# Patient Record
Sex: Female | Born: 1995 | Race: White | Hispanic: No | Marital: Married | Smoking: Never smoker
Health system: Southern US, Community
[De-identification: ages and names within clinical notes are randomized; demographics above are authoritative.]

## PROBLEM LIST (undated history)

## (undated) ENCOUNTER — Inpatient Hospital Stay (HOSPITAL_COMMUNITY): Payer: Self-pay

## (undated) DIAGNOSIS — F909 Attention-deficit hyperactivity disorder, unspecified type: Secondary | ICD-10-CM

## (undated) DIAGNOSIS — K589 Irritable bowel syndrome without diarrhea: Secondary | ICD-10-CM

## (undated) DIAGNOSIS — J45909 Unspecified asthma, uncomplicated: Secondary | ICD-10-CM

## (undated) DIAGNOSIS — F419 Anxiety disorder, unspecified: Secondary | ICD-10-CM

## (undated) DIAGNOSIS — Z8742 Personal history of other diseases of the female genital tract: Secondary | ICD-10-CM

## (undated) HISTORY — PX: WISDOM TOOTH EXTRACTION: SHX21

## (undated) NOTE — *Deleted (*Deleted)
Hypoglycemic Event  CBG: 61 Treatment: 4 oz of apple juice given at 1835  Symptoms: lightheaded  Follow-up CBG: Time:1828 CBG Result:61  Possible Reasons for Event:GDM on clear liquids Comments/MD notified:     Jamelle Rushing

---

## 2015-07-12 ENCOUNTER — Other Ambulatory Visit (HOSPITAL_COMMUNITY)
Admission: RE | Admit: 2015-07-12 | Discharge: 2015-07-12 | Disposition: A | Discharge status: Home / Self Care | Payer: BLUE CROSS/BLUE SHIELD | Source: Ambulatory Visit | Attending: Family Medicine | Admitting: Family Medicine

## 2015-07-12 DIAGNOSIS — Z01411 Encounter for gynecological examination (general) (routine) with abnormal findings: Secondary | ICD-10-CM | POA: Insufficient documentation

## 2016-08-01 DIAGNOSIS — M79674 Pain in right toe(s): Secondary | ICD-10-CM | POA: Diagnosis not present

## 2017-01-25 DIAGNOSIS — J019 Acute sinusitis, unspecified: Secondary | ICD-10-CM | POA: Diagnosis not present

## 2017-04-08 DIAGNOSIS — R5383 Other fatigue: Secondary | ICD-10-CM | POA: Diagnosis not present

## 2017-04-08 DIAGNOSIS — J069 Acute upper respiratory infection, unspecified: Secondary | ICD-10-CM | POA: Diagnosis not present

## 2017-07-10 DIAGNOSIS — K581 Irritable bowel syndrome with constipation: Secondary | ICD-10-CM | POA: Diagnosis not present

## 2017-07-10 DIAGNOSIS — G4700 Insomnia, unspecified: Secondary | ICD-10-CM | POA: Diagnosis not present

## 2017-08-30 DIAGNOSIS — K581 Irritable bowel syndrome with constipation: Secondary | ICD-10-CM | POA: Diagnosis not present

## 2017-08-30 DIAGNOSIS — Z23 Encounter for immunization: Secondary | ICD-10-CM | POA: Diagnosis not present

## 2017-08-30 DIAGNOSIS — Z0000 Encounter for general adult medical examination without abnormal findings: Secondary | ICD-10-CM | POA: Diagnosis not present

## 2018-04-27 ENCOUNTER — Ambulatory Visit (HOSPITAL_COMMUNITY)
Admission: EM | Admit: 2018-04-27 | Discharge: 2018-04-27 | Disposition: A | Discharge status: Home / Self Care | Payer: 59 | Source: Home / Self Care | Attending: Family Medicine | Admitting: Family Medicine

## 2018-04-27 ENCOUNTER — Emergency Department (HOSPITAL_COMMUNITY): Payer: 59

## 2018-04-27 ENCOUNTER — Emergency Department (HOSPITAL_COMMUNITY)
Admission: EM | Admit: 2018-04-27 | Discharge: 2018-04-27 | Disposition: A | Discharge status: Home / Self Care | Payer: 59 | Attending: Emergency Medicine | Admitting: Emergency Medicine

## 2018-04-27 ENCOUNTER — Other Ambulatory Visit: Payer: Self-pay

## 2018-04-27 ENCOUNTER — Encounter (HOSPITAL_COMMUNITY): Payer: Self-pay | Admitting: Emergency Medicine

## 2018-04-27 ENCOUNTER — Encounter (HOSPITAL_COMMUNITY): Payer: Self-pay | Admitting: *Deleted

## 2018-04-27 DIAGNOSIS — J40 Bronchitis, not specified as acute or chronic: Secondary | ICD-10-CM | POA: Diagnosis not present

## 2018-04-27 DIAGNOSIS — R069 Unspecified abnormalities of breathing: Secondary | ICD-10-CM | POA: Insufficient documentation

## 2018-04-27 DIAGNOSIS — R42 Dizziness and giddiness: Secondary | ICD-10-CM | POA: Insufficient documentation

## 2018-04-27 DIAGNOSIS — R6889 Other general symptoms and signs: Secondary | ICD-10-CM

## 2018-04-27 DIAGNOSIS — R05 Cough: Secondary | ICD-10-CM

## 2018-04-27 DIAGNOSIS — R0602 Shortness of breath: Secondary | ICD-10-CM

## 2018-04-27 DIAGNOSIS — R059 Cough, unspecified: Secondary | ICD-10-CM

## 2018-04-27 DIAGNOSIS — R0789 Other chest pain: Secondary | ICD-10-CM

## 2018-04-27 DIAGNOSIS — R509 Fever, unspecified: Secondary | ICD-10-CM | POA: Insufficient documentation

## 2018-04-27 DIAGNOSIS — J069 Acute upper respiratory infection, unspecified: Secondary | ICD-10-CM

## 2018-04-27 HISTORY — DX: Irritable bowel syndrome, unspecified: K58.9

## 2018-04-27 HISTORY — DX: Anxiety disorder, unspecified: F41.9

## 2018-04-27 LAB — CBC WITH DIFFERENTIAL/PLATELET
Abs Immature Granulocytes: 0.02 10*3/uL (ref 0.00–0.07)
Basophils Absolute: 0 10*3/uL (ref 0.0–0.1)
Basophils Relative: 1 %
Eosinophils Absolute: 0.1 10*3/uL (ref 0.0–0.5)
Eosinophils Relative: 1 %
HCT: 42.1 % (ref 36.0–46.0)
HEMOGLOBIN: 13.2 g/dL (ref 12.0–15.0)
Immature Granulocytes: 0 %
Lymphocytes Relative: 18 %
Lymphs Abs: 1 10*3/uL (ref 0.7–4.0)
MCH: 28.3 pg (ref 26.0–34.0)
MCHC: 31.4 g/dL (ref 30.0–36.0)
MCV: 90.1 fL (ref 80.0–100.0)
MONO ABS: 0.8 10*3/uL (ref 0.1–1.0)
Monocytes Relative: 15 %
Neutro Abs: 3.7 10*3/uL (ref 1.7–7.7)
Neutrophils Relative %: 65 %
Platelets: 204 10*3/uL (ref 150–400)
RBC: 4.67 MIL/uL (ref 3.87–5.11)
RDW: 12.6 % (ref 11.5–15.5)
WBC: 5.6 10*3/uL (ref 4.0–10.5)
nRBC: 0 % (ref 0.0–0.2)

## 2018-04-27 LAB — BASIC METABOLIC PANEL
ANION GAP: 9 (ref 5–15)
BUN: 9 mg/dL (ref 6–20)
CO2: 25 mmol/L (ref 22–32)
Calcium: 9.2 mg/dL (ref 8.9–10.3)
Chloride: 105 mmol/L (ref 98–111)
Creatinine, Ser: 0.96 mg/dL (ref 0.44–1.00)
GFR calc Af Amer: >60 mL/min (ref >60)
GFR calc non Af Amer: >60 mL/min (ref >60)
Glucose, Bld: 118 mg/dL — ABNORMAL HIGH (ref 70–99)
Potassium: 3.8 mmol/L (ref 3.5–5.1)
Sodium: 139 mmol/L (ref 135–145)

## 2018-04-27 LAB — I-STAT BETA HCG BLOOD, ED (MC, WL, AP ONLY)

## 2018-04-27 LAB — I-STAT TROPONIN, ED: Troponin i, poc: 0.01 ng/mL (ref 0.00–0.08)

## 2018-04-27 MED ORDER — ONDANSETRON HCL 4 MG/2ML IJ SOLN
4.0000 mg | Freq: Once | INTRAMUSCULAR | Status: AC
  Administered 2018-04-27: 4 mg via INTRAVENOUS
  Filled 2018-04-27: qty 2

## 2018-04-27 MED ORDER — IBUPROFEN 800 MG PO TABS
800.0000 mg | ORAL_TABLET | Freq: Once | ORAL | Status: AC
  Administered 2018-04-27: 800 mg via ORAL
  Filled 2018-04-27: qty 1

## 2018-04-27 MED ORDER — ALBUTEROL SULFATE (2.5 MG/3ML) 0.083% IN NEBU
5.0000 mg | INHALATION_SOLUTION | Freq: Once | RESPIRATORY_TRACT | Status: AC
  Administered 2018-04-27: 5 mg via RESPIRATORY_TRACT
  Filled 2018-04-27: qty 6

## 2018-04-27 MED ORDER — SODIUM CHLORIDE 0.9 % IV BOLUS
1000.0000 mL | Freq: Once | INTRAVENOUS | Status: AC
  Administered 2018-04-27: 1000 mL via INTRAVENOUS

## 2018-04-27 MED ORDER — AZITHROMYCIN 250 MG PO TABS: ORAL_TABLET | ORAL | 0 refills | Status: DC

## 2018-04-27 MED ORDER — IPRATROPIUM BROMIDE 0.02 % IN SOLN
0.5000 mg | Freq: Once | RESPIRATORY_TRACT | Status: AC
  Administered 2018-04-27: 0.5 mg via RESPIRATORY_TRACT
  Filled 2018-04-27: qty 2.5

## 2018-04-27 MED ORDER — ALBUTEROL SULFATE HFA 108 (90 BASE) MCG/ACT IN AERS: 1.0000 | INHALATION_SPRAY | RESPIRATORY_TRACT | 0 refills | Status: AC | PRN

## 2018-04-27 NOTE — ED Provider Notes (Signed)
[facility] EMERGENCY DEPARTMENT Provider Note   CSN: [id number] Arrival date & time: 04/27/18  1207     History   Chief Complaint No chief complaint on file.   HPI Kimberly Hunter is a 23 y.o. female with a PMHx of anxiety and IBS, who presents to the ED with complaints of pneumonia type symptoms for several days.  Patient states that the end of December she got "sick" and was seen at [facility] physicians where she was told she had a virus and given cough medicine.  She states that initially she got better but then 3 days ago she started getting worse.  Over the last 3 days she is had a fever with Tmax 102.0, shortness of breath, cough with yellow sputum production, lightheadedness with standing, chest pain, nausea, congestion, chills, and fatigue.  She describes her chest pain is 7/10 constant sharp nonradiating central chest pain that worsens with cough and has been unrelieved with Mucinex and Tylenol.  She did take Tylenol prior to arrival.  She works at [employer] so she is not sure whether she could have come into contact with some sick people.  She did not receive her flu shot this year.  She is a non-smoker.  She denies any ear pain or drainage, sore throat, leg swelling, recent travel/surgery/immobilization, estrogen use, personal history of DVT/PE, abdominal pain, vomiting, diarrhea, constipation, dysuria, hematuria, myalgias, arthralgias, numbness, tingling, focal weakness, rashes, or any other complaints at this time.  No known family history of cardiac disease.  The history is provided by the patient and medical records. No language interpreter was used.    Past Medical History:  Diagnosis Date  . Anxiety   . IBS (irritable bowel syndrome)     There are no active problems to display for this patient.   History reviewed. No pertinent surgical history.   OB History   No obstetric history on file.      Home Medications    Prior to Admission medications     Medication Sig Start Date End Date Taking? Authorizing Provider  ESCITALOPRAM OXALATE PO Take by mouth.    [provider]  Lubiprostone (AMITIZA PO) Take by mouth.    [provider]    Family History Family History  Problem Relation Age of Onset  . Diabetes Mother   . Hypertension Mother   . Hypercholesterolemia Mother   . Hypertension Father   . Diabetes Sister     Social History Social History   Tobacco Use  . Smoking status: Never Smoker  . Smokeless tobacco: Never Used  Substance Use Topics  . Alcohol use: Yes    Comment: occasionally  . Drug use: Never     Allergies   Patient has no known allergies.   Review of Systems Review of Systems  Constitutional: Positive for chills, fatigue and fever.  HENT: Positive for congestion. Negative for ear discharge, ear pain and sore throat.   Respiratory: Positive for cough and shortness of breath.   Cardiovascular: Positive for chest pain. Negative for leg swelling.  Gastrointestinal: Positive for nausea. Negative for abdominal pain, constipation, diarrhea and vomiting.  Genitourinary: Negative for dysuria and hematuria.  Musculoskeletal: Negative for arthralgias and myalgias.  Skin: Negative for color change and rash.  Allergic/Immunologic: Negative for immunocompromised state.  Neurological: Positive for light-headedness (with standing). Negative for weakness and numbness.  Psychiatric/Behavioral: Negative for confusion.   All other systems reviewed and are negative for acute change except as noted in the  HPI.    Physical Exam Updated Vital Signs BP (!) 138/94   Pulse 100   Temp 100.2 F (37.9 C) (Oral)   Ht 5\' 7"  (1.702 m)   Wt 95.3 kg   LMP 04/07/2018 (Approximate)   SpO2 97%   BMI 32.89 kg/m   Physical Exam Vitals signs and nursing note reviewed.  Constitutional:      General: She is not in acute distress.    Appearance: Normal appearance. She is well-developed. She is not  toxic-appearing.     Comments: Low grade temp 100.2, nontoxic, NAD, coughing throughout most of exam  HENT:     Head: Normocephalic and atraumatic.     Nose: Congestion present.  Eyes:     General:        Right eye: No discharge.        Left eye: No discharge.     Conjunctiva/sclera: Conjunctivae normal.  Neck:     Musculoskeletal: Normal range of motion and neck supple.  Cardiovascular:     Rate and Rhythm: Regular rhythm. Tachycardia present.     Pulses: Normal pulses.     Heart sounds: Normal heart sounds, S1 normal and S2 normal. No murmur. No friction rub. No gallop.      Comments: Tachycardic in the low 100s unless coughing at which time it goes into the 110s. Reg rhythm, nl s1/s2, no m/r/g, distal pulses intact, no pedal edema.  Pulmonary:     Effort: Pulmonary effort is normal. No respiratory distress.     Breath sounds: Wheezing and rhonchi present. No decreased breath sounds or rales.     Comments: Faint wheezing and rhonchi in RLL, no rales, no hypoxia or increased WOB, speaking in full sentences except coughing throughout most of exam, SpO2 97% on RA  Chest:     Chest wall: Tenderness present. No deformity or crepitus.     Comments: Mild diffuse anterior chest wall TTP, no crepitus or deformity, no retractions Abdominal:     General: Bowel sounds are normal. There is no distension.     Palpations: Abdomen is soft. Abdomen is not rigid.     Tenderness: There is no abdominal tenderness. There is no right CVA tenderness, left CVA tenderness, guarding or rebound. Negative signs include Murphy's sign and McBurney's sign.  Musculoskeletal: Normal range of motion.  Skin:    General: Skin is warm and dry.     Findings: No rash.  Neurological:     Mental Status: She is alert and oriented to person, place, and time.     Sensory: Sensation is intact. No sensory deficit.     Motor: Motor function is intact.  Psychiatric:        Mood and Affect: Mood and affect normal.         Behavior: Behavior normal.      ED Treatments / Results  Labs (all labs ordered are listed, but only abnormal results are displayed) Labs Reviewed  BASIC METABOLIC PANEL - Abnormal; Notable for the following components:      Result Value   Glucose, Bld 118 (*)    All other components within normal limits  CBC WITH DIFFERENTIAL/PLATELET  I-STAT TROPONIN, ED  I-STAT BETA HCG BLOOD, ED ([facility], [facility], [facility] ONLY)    EKG EKG Interpretation  Date/Time:  Sunday April 27 2018 13:28:33 EST Ventricular Rate:  120 PR Interval:    QRS Duration: 82 QT Interval:  328 QTC Calculation: 464 R Axis:   77 Text Interpretation:  Sinus tachycardia Nonspecific T abnormalities, anterior leads Baseline wander in lead(s) II III aVF No old tracing to compare Confirmed by Azalia Bilisampos, Kevin ([id number]) on 04/27/2018 1:53:20 PM   Radiology Dg Chest 2 View  Result Date: 04/27/2018 CLINICAL DATA:  Cough, chest pain and SOB. EXAM: CHEST - 2 VIEW COMPARISON:  None FINDINGS: The heart size and mediastinal contours are within normal limits. Both lungs are clear. The visualized skeletal structures are unremarkable. IMPRESSION: No active cardiopulmonary disease. Electronically Signed   By: Signa Kellaylor  Stroud M.D.   On: 04/27/2018 14:00    Procedures Procedures (including critical care time)  Medications Ordered in ED Medications  ibuprofen (ADVIL,MOTRIN) tablet 800 mg (800 mg Oral Given 04/27/18 1258)  sodium chloride 0.9 % bolus 1,000 mL (0 mLs Intravenous Stopped 04/27/18 1403)  ondansetron (ZOFRAN) injection 4 mg (4 mg Intravenous Given 04/27/18 1258)  albuterol (PROVENTIL) (2.5 MG/3ML) 0.083% nebulizer solution 5 mg (5 mg Nebulization Given 04/27/18 1302)  ipratropium (ATROVENT) nebulizer solution 0.5 mg (0.5 mg Nebulization Given 04/27/18 1357)     Initial Impression / Assessment and Plan / ED Course  I have reviewed the triage vital signs and the nursing notes.  Pertinent labs & imaging results that were available  during my care of the patient were reviewed by me and considered in my medical decision making (see chart for details).     23 y.o. female here with worsening cough.  States that she was sick at the end of December, got better initially however in the last 3 days she has gotten worse.  On exam, faint wheezing and rhonchi in the right lower lobe, harsh cough throughout entirety of visit, low-grade temp 100.2, mildly tachycardic especially when she coughs, however no increased work of breathing and no hypoxia.  No leg swelling.  We this sounds like pneumonia versus bronchitis doubt PE.  Chest wall tender to palpation anteriorly.  Will get labs, chest x-ray, EKG, and give ibuprofen, fluids, Zofran, and duo nebs, and reassess shortly.  Doubt need for flu testing since she is outside the Tamiflu window. Does not appear septic, doubt need for code sepsis labs/fluids/etc. Will reassess shortly.   2:48 PM CBC w/diff WNL. BMP unremarkable. Trop neg. BetaHCG neg. CXR negative. EKG with nonspecific T changes but overall without acute ischemic findings. Pt feeling better and lung sounds improved after duonebs. VSS improving after interventions. Overall, given duration of symptoms and sudden worsening after period of improvement, will empirically cover for atypical bacterial infection. Advised that she may have a viral illness and abx won't change the course of that, but again I think it's reasonable given the course of her illness. Will send home with inhaler rx, pt states she has a brand new one at home so no need to send her home with one from here. Advised OTC remedies for symptomatic relief, and f/up with PCP in 1wk for recheck. I explained the diagnosis and have given explicit precautions to return to the ER including for any other new or worsening symptoms. The patient understands and accepts the medical plan as it's been dictated and I have answered their questions. Discharge instructions concerning home care and  prescriptions have been given. The patient is STABLE and is discharged to home in good condition.    Final Clinical Impressions(s) / ED Diagnoses   Final diagnoses:  Bronchitis  Upper respiratory tract infection, unspecified type  Flu-like symptoms  Chest wall pain  SOB (shortness of breath)    ED Discharge Orders  Ordered    azithromycin (ZITHROMAX Z-PAK) 250 MG tablet     04/27/18 1448    albuterol (PROVENTIL HFA;VENTOLIN HFA) 108 (90 Base) MCG/ACT inhaler  Every 4 hours PRN     04/27/18 [address]1448           Timmia Cogburn, RomeMercedes, Cordelia Poche-C 04/27/18 1454    Azalia Bilisampos, Kevin, MD 04/27/18 [id number]

## 2018-04-27 NOTE — ED Notes (Signed)
Patient verbalizes understanding of discharge instructions. Opportunity for questioning and answers were provided. Armband removed by staff, pt discharged from ED ambulatory to home.  

## 2018-04-27 NOTE — ED Triage Notes (Signed)
C/O URI sxs since 03/25/18; states cough has never resolved.  Now having fever over past couple days.

## 2018-04-27 NOTE — Discharge Instructions (Signed)
Your work up today was reassuring, but since your symptoms have worsened recently, we are treating you with antibiotics to cover for possible bacterial infections. Take antibiotic as directed until completed. Continue to stay well-hydrated. Gargle warm salt water and spit it out and use chloraseptic spray as needed for sore throat. Continue to alternate between Tylenol and Ibuprofen for pain or fever. Use Mucinex/Robitussin/etc for cough suppression/expectoration of mucus. Use over the counter flonase and the netipot to help with nasal congestion. May consider over-the-counter Benadryl or other antihistamine like Claritin/Zyrtec/etc to decrease secretions and for help with your symptoms. Use inhaler as directed, as needed for cough/chest congestion/wheezing/shortness of breath. Follow up with your primary care doctor in 5-7 days for recheck of ongoing symptoms. Return to emergency department for emergent changing or worsening of symptoms.

## 2018-04-27 NOTE — ED Notes (Signed)
Bed: UC03 Expected date:  Expected time:  Means of arrival:  Comments: Cleaning

## 2018-04-27 NOTE — ED Triage Notes (Signed)
Pt from UC and diagnosed with pneumonia. Pt sent here stating they told her she needs IV antibiotics. No xrays completed

## 2018-04-27 NOTE — ED Provider Notes (Signed)
04/27/2018 11:46 AM   DOB: Jun 30, 1995 / MRN: [id number]  SUBJECTIVE:  Kimberly Hunter is a 23 y.o. female presenting for cough.  Reports she had cold-like symptoms starting on 03/25/2018 but the cough never resolved.  Reports that last night she began having chest pain, shortness of breath, DOE, dizziness upon standing.  Reports that her boyfriend had to help her get into the car this morning to come here.  She has a history of pneumonia.  She has No Known Allergies.   She  has a past medical history of Anxiety and IBS (irritable bowel syndrome).    She  reports that she has never smoked. She has never used smokeless tobacco. She reports current alcohol use. She reports that she does not use drugs. She  has no history on file for sexual activity. The patient  has no past surgical history on file.  Her family history includes Diabetes in her mother and sister; Hypercholesterolemia in her mother; Hypertension in her father and mother.  ROS per HPI  OBJECTIVE:  BP 130/79   Pulse (!) 103   Temp 99.3 F (37.4 C) (Temporal)   Resp 16   LMP 04/07/2018 (Approximate)   SpO2 98%   Wt Readings from Last 3 Encounters:  No data found for Wt   Temp Readings from Last 3 Encounters:  04/27/18 99.3 F (37.4 C) (Temporal)   BP Readings from Last 3 Encounters:  04/27/18 130/79   Pulse Readings from Last 3 Encounters:  04/27/18 (!) 103    Physical Exam Vitals signs reviewed.  Constitutional:      General: She is not in acute distress.    Appearance: She is ill-appearing. She is not toxic-appearing or diaphoretic.  Eyes:     Pupils: Pupils are equal, round, and reactive to light.  Cardiovascular:     Rate and Rhythm: Tachycardia present.  Pulmonary:     Effort: Pulmonary effort is normal. No respiratory distress.     Breath sounds: No stridor. No wheezing, rhonchi or rales.  Chest:     Chest wall: No tenderness.  Abdominal:     General: There is no distension.  Skin:    General:  Skin is dry.  Neurological:     Mental Status: She is alert and oriented to person, place, and time.     Cranial Nerves: No cranial nerve deficit.     Gait: Gait normal.     No results found for this or any previous visit (from the past 72 hour(s)).  No results found.  ASSESSMENT AND PLAN:   Cough: I am concerned clinically for pneumonia.  She is telling me she is having chest pain shortness of breath along with dizziness.  We have discussed these symptoms and given the worrisome nature she will go down to the emergency room for further evaluation and treatment.  Febrile illness  Orthostatic dizziness  Discharge Instructions   None        The patient is advised to call or return to clinic if she does not see an improvement in symptoms, or to seek the care of the closest emergency department if she worsens with the above plan.   Deliah Boston, MHS, PA-C 04/27/2018 11:46 AM   Ofilia Neas, PA-C 04/27/18 1147

## 2018-08-27 ENCOUNTER — Other Ambulatory Visit (HOSPITAL_COMMUNITY)
Admission: RE | Admit: 2018-08-27 | Discharge: 2018-08-27 | Disposition: A | Discharge status: Home / Self Care | Payer: 59 | Source: Ambulatory Visit | Attending: Obstetrics and Gynecology | Admitting: Obstetrics and Gynecology

## 2018-08-27 ENCOUNTER — Other Ambulatory Visit: Payer: Self-pay | Admitting: Obstetrics and Gynecology

## 2018-08-27 DIAGNOSIS — Z124 Encounter for screening for malignant neoplasm of cervix: Secondary | ICD-10-CM | POA: Insufficient documentation

## 2018-08-28 LAB — CYTOLOGY - PAP: Diagnosis: NEGATIVE

## 2018-11-21 ENCOUNTER — Other Ambulatory Visit: Payer: Self-pay

## 2018-11-21 ENCOUNTER — Emergency Department (HOSPITAL_COMMUNITY)
Admission: EM | Admit: 2018-11-21 | Discharge: 2018-11-21 | Disposition: A | Discharge status: Home / Self Care | Payer: 59 | Attending: Emergency Medicine | Admitting: Emergency Medicine

## 2018-11-21 ENCOUNTER — Encounter (HOSPITAL_COMMUNITY): Payer: Self-pay | Admitting: Emergency Medicine

## 2018-11-21 ENCOUNTER — Emergency Department (HOSPITAL_COMMUNITY): Payer: 59

## 2018-11-21 DIAGNOSIS — Z79899 Other long term (current) drug therapy: Secondary | ICD-10-CM | POA: Insufficient documentation

## 2018-11-21 DIAGNOSIS — M542 Cervicalgia: Secondary | ICD-10-CM | POA: Insufficient documentation

## 2018-11-21 DIAGNOSIS — M79602 Pain in left arm: Secondary | ICD-10-CM | POA: Insufficient documentation

## 2018-11-21 MED ORDER — ACETAMINOPHEN 500 MG PO TABS
1000.0000 mg | ORAL_TABLET | Freq: Once | ORAL | Status: AC
  Administered 2018-11-21: 1000 mg via ORAL
  Filled 2018-11-21: qty 2

## 2018-11-21 MED ORDER — CYCLOBENZAPRINE HCL 10 MG PO TABS: 10.0000 mg | ORAL_TABLET | Freq: Two times a day (BID) | ORAL | 0 refills | Status: DC | PRN

## 2018-11-21 NOTE — ED Notes (Signed)
Pt transported to xray 
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
## 2019-02-24 ENCOUNTER — Inpatient Hospital Stay (HOSPITAL_COMMUNITY)
Admission: AD | Admit: 2019-02-24 | Discharge: 2019-02-24 | Disposition: A | Discharge status: Home / Self Care | Payer: 59 | Attending: Obstetrics & Gynecology | Admitting: Obstetrics & Gynecology

## 2019-02-24 ENCOUNTER — Other Ambulatory Visit: Payer: Self-pay

## 2019-02-24 ENCOUNTER — Encounter (HOSPITAL_COMMUNITY): Payer: Self-pay | Admitting: *Deleted

## 2019-02-24 DIAGNOSIS — R102 Pelvic and perineal pain: Secondary | ICD-10-CM | POA: Diagnosis not present

## 2019-02-24 DIAGNOSIS — Z3A01 Less than 8 weeks gestation of pregnancy: Secondary | ICD-10-CM | POA: Diagnosis not present

## 2019-02-24 DIAGNOSIS — K589 Irritable bowel syndrome without diarrhea: Secondary | ICD-10-CM | POA: Insufficient documentation

## 2019-02-24 DIAGNOSIS — Z79899 Other long term (current) drug therapy: Secondary | ICD-10-CM | POA: Insufficient documentation

## 2019-02-24 DIAGNOSIS — O26891 Other specified pregnancy related conditions, first trimester: Secondary | ICD-10-CM

## 2019-02-24 DIAGNOSIS — O200 Threatened abortion: Secondary | ICD-10-CM | POA: Diagnosis not present

## 2019-02-24 DIAGNOSIS — F419 Anxiety disorder, unspecified: Secondary | ICD-10-CM | POA: Insufficient documentation

## 2019-02-24 DIAGNOSIS — J45909 Unspecified asthma, uncomplicated: Secondary | ICD-10-CM | POA: Diagnosis not present

## 2019-02-24 DIAGNOSIS — O209 Hemorrhage in early pregnancy, unspecified: Secondary | ICD-10-CM

## 2019-02-24 DIAGNOSIS — O99341 Other mental disorders complicating pregnancy, first trimester: Secondary | ICD-10-CM | POA: Diagnosis not present

## 2019-02-24 DIAGNOSIS — O99511 Diseases of the respiratory system complicating pregnancy, first trimester: Secondary | ICD-10-CM | POA: Insufficient documentation

## 2019-02-24 DIAGNOSIS — O99611 Diseases of the digestive system complicating pregnancy, first trimester: Secondary | ICD-10-CM | POA: Insufficient documentation

## 2019-02-24 DIAGNOSIS — O3680X0 Pregnancy with inconclusive fetal viability, not applicable or unspecified: Secondary | ICD-10-CM

## 2019-02-24 HISTORY — DX: Unspecified asthma, uncomplicated: J45.909

## 2019-02-24 HISTORY — DX: Personal history of other diseases of the female genital tract: Z87.42

## 2019-02-24 LAB — URINALYSIS, ROUTINE W REFLEX MICROSCOPIC
Bilirubin Urine: NEGATIVE
Glucose, UA: NEGATIVE mg/dL
Ketones, ur: NEGATIVE mg/dL
Nitrite: NEGATIVE
Protein, ur: 30 mg/dL — AB
RBC / HPF: >50 RBC/hpf — ABNORMAL HIGH (ref 0–5)
Specific Gravity, Urine: 1.014 (ref 1.005–1.030)
pH: 5 (ref 5.0–8.0)

## 2019-02-24 LAB — ABO/RH: ABO/RH(D): A POS

## 2019-02-24 LAB — HCG, QUANTITATIVE, PREGNANCY: hCG, Beta Chain, Quant, S: 14 m[IU]/mL — ABNORMAL HIGH (ref <5)

## 2019-02-24 LAB — POCT PREGNANCY, URINE: Preg Test, Ur: NEGATIVE

## 2019-02-24 NOTE — Discharge Instructions (Signed)
Vaginal Bleeding During Pregnancy, First Trimester ° °A small amount of bleeding from the vagina (spotting) is relatively common during early pregnancy. It usually stops on its own. Various things may cause bleeding or spotting during early pregnancy. Some bleeding may be related to the pregnancy, and some may not. In many cases, the bleeding is normal and is not a problem. However, bleeding can also be a sign of something serious. Be sure to tell your health care provider about any vaginal bleeding right away. °Some possible causes of vaginal bleeding during the first trimester include: °· Infection or inflammation of the cervix. °· Growths (polyps) on the cervix. °· Miscarriage or threatened miscarriage. °· Pregnancy tissue developing outside of the uterus (ectopic pregnancy). °· A mass of tissue developing in the uterus due to an egg being fertilized incorrectly (molar pregnancy). °Follow these instructions at home: °Activity °· Follow instructions from your health care provider about limiting your activity. Ask what activities are safe for you. °· If needed, make plans for someone to help with your regular activities. °· Do not have sex or orgasms until your health care provider says that this is safe. °General instructions °· Take over-the-counter and prescription medicines only as told by your health care provider. °· Pay attention to any changes in your symptoms. °· Do not use tampons or douche. °· Write down how many pads you use each day, how often you change pads, and how soaked (saturated) they are. °· If you pass any tissue from your vagina, save the tissue so you can show it to your health care provider. °· Keep all follow-up visits as told by your health care provider. This is important. °Contact a health care provider if: °· You have vaginal bleeding during any part of your pregnancy. °· You have cramps or labor pains. °· You have a fever. °Get help right away if: °· You have severe cramps in your  back or abdomen. °· You pass large clots or a large amount of tissue from your vagina. °· Your bleeding increases. °· You feel light-headed or weak, or you faint. °· You have chills. °· You are leaking fluid or have a gush of fluid from your vagina. °Summary °· A small amount of bleeding (spotting) from the vagina is relatively common during early pregnancy. °· Various things may cause bleeding or spotting in early pregnancy. °· Be sure to tell your health care provider about any vaginal bleeding right away. °This information is not intended to replace advice given to you by your health care provider. Make sure you discuss any questions you have with your health care provider. °Document Released: 12/27/2004 Document Revised: 07/08/2018 Document Reviewed: 06/21/2016 °Elsevier Patient Education © 2020 [employer] ° °

## 2019-02-24 NOTE — MAU Note (Signed)
Abdominal pain today that has gotten worse along with vag bleeding. Bleeding was brown initially but now is red

## 2019-02-24 NOTE — MAU Provider Note (Signed)
Chief Complaint: Vaginal Bleeding and Abdominal Pain   First Provider Initiated Contact with Patient 02/24/19 2313        SUBJECTIVE HPI: Kimberly Hunter is a 23 y.o. G1P0 at [redacted]w[redacted]d by LMP who presents to maternity admissions reporting pelvic pain and vaginal bleeding.  Pain has increased today.  Bleeding had started as brown and is now red.  She denies vaginal itching/burning, urinary symptoms, h/a, dizziness, n/v, or fever/chills.    Vaginal Bleeding The patient's primary symptoms include pelvic pain and vaginal bleeding. The patient's pertinent negatives include no genital itching, genital lesions or genital odor. This is a recurrent problem. The current episode started today. The problem occurs constantly. The problem has been unchanged. She is pregnant. Associated symptoms include abdominal pain. Pertinent negatives include no constipation, diarrhea, dysuria, fever, headaches, nausea or vomiting. The vaginal discharge was bloody. The vaginal bleeding is lighter than menses. She has not been passing clots. She has not been passing tissue. Nothing aggravates the symptoms. She has tried nothing for the symptoms.  Abdominal Pain This is a recurrent problem. The current episode started today. The onset quality is gradual. The problem occurs intermittently. The problem has been unchanged. The pain is located in the LLQ, RLQ and suprapubic region. The quality of the pain is cramping. The abdominal pain does not radiate. Pertinent negatives include no constipation, diarrhea, dysuria, fever, headaches, nausea or vomiting. Nothing aggravates the pain. The pain is relieved by nothing. She has tried nothing for the symptoms.   RN Note: Abdominal pain today that has gotten worse along with vag bleeding. Bleeding was brown initially but now is red  Past Medical History:  Diagnosis Date  . Anxiety   . Asthma   . History of PCOS   . IBS (irritable bowel syndrome)    Past Surgical History:   Procedure Laterality Date  . NO PAST SURGERIES     Social History   Socioeconomic History  . Marital status: Married    Spouse name: Not on file  . Number of children: Not on file  . Years of education: Not on file  . Highest education level: Not on file  Occupational History  . Not on file  Social Needs  . Financial resource strain: Not on file  . Food insecurity    Worry: Not on file    Inability: Not on file  . Transportation needs    Medical: Not on file    Non-medical: Not on file  Tobacco Use  . Smoking status: Never Smoker  . Smokeless tobacco: Never Used  Substance and Sexual Activity  . Alcohol use: Not Currently    Comment: occasionally  . Drug use: Never  . Sexual activity: Not on file  Lifestyle  . Physical activity    Days per week: Not on file    Minutes per session: Not on file  . Stress: Not on file  Relationships  . Social Herbalist on phone: Not on file    Gets together: Not on file    Attends religious service: Not on file    Active member of club or organization: Not on file    Attends meetings of clubs or organizations: Not on file    Relationship status: Not on file  . Intimate partner violence    Fear of current or ex partner: Not on file    Emotionally abused: Not on file    Physically abused: Not on file    Forced  sexual activity: Not on file  Other Topics Concern  . Not on file  Social History Narrative  . Not on file   No current facility-administered medications on file prior to encounter.    Current Outpatient Medications on File Prior to Encounter  Medication Sig Dispense Refill  . Prenatal Vit-Fe Fumarate-FA (PRENATAL MULTIVITAMIN) TABS tablet Take 1 tablet by mouth daily at 12 noon.    Marland Kitchen albuterol (PROVENTIL HFA;VENTOLIN HFA) 108 (90 Base) MCG/ACT inhaler Inhale 1-2 puffs into the lungs every 4 (four) hours as needed for wheezing or shortness of breath (or cough). 1 Inhaler 0  . azithromycin (ZITHROMAX Z-PAK) 250  MG tablet 2 po day one, then 1 daily x 4 days 6 tablet 0  . cyclobenzaprine (FLEXERIL) 10 MG tablet Take 1 tablet (10 mg total) by mouth 2 (two) times daily as needed for muscle spasms. 14 tablet 0  . ESCITALOPRAM OXALATE PO Take by mouth.    . Lubiprostone (AMITIZA PO) Take by mouth.     No Known Allergies  I have reviewed patient's Past Medical Hx, Surgical Hx, Family Hx, Social Hx, medications and allergies.   ROS:  Review of Systems  Constitutional: Negative for fever.  Gastrointestinal: Positive for abdominal pain. Negative for constipation, diarrhea, nausea and vomiting.  Genitourinary: Positive for pelvic pain and vaginal bleeding. Negative for dysuria.  Neurological: Negative for headaches.   Review of Systems  Other systems negative   Physical Exam  Physical Exam Patient Vitals for the past 24 hrs:  BP Temp Pulse Resp Height Weight  02/24/19 2016 136/83 98.1 F (36.7 C) 79 18 - -  02/24/19 2014 - - - - 5\' 7"  (1.702 m) 98.9 kg   Constitutional: Well-developed, well-nourished female in no acute distress.  Cardiovascular: normal rate Respiratory: normal effort GI: Abd soft, non-tender. Pos BS x 4 MS: Extremities nontender, no edema, normal ROM Neurologic: Alert and oriented x 4.  GU: Neg CVAT.  PELVIC EXAM: Cervix pink, visually closed, without lesion, small bloody discharge, vaginal walls and external genitalia normal Bimanual exam: Cervix 0/long/high, firm, anterior, neg CMT, uterus nontender, nonenlarged, adnexa without tenderness, enlargement, or mass   LAB RESULTS Results for orders placed or performed during the hospital encounter of 02/24/19 (from the past 24 hour(s))  Pregnancy, urine POC     Status: None   Collection Time: 02/24/19  9:04 PM  Result Value Ref Range   Preg Test, Ur NEGATIVE NEGATIVE  Urinalysis, Routine w reflex microscopic     Status: Abnormal   Collection Time: 02/24/19  9:27 PM  Result Value Ref Range   Color, Urine YELLOW YELLOW    APPearance CLEAR CLEAR   Specific Gravity, Urine 1.014 1.005 - 1.030   pH 5.0 5.0 - 8.0   Glucose, UA NEGATIVE NEGATIVE mg/dL   Hgb urine dipstick LARGE (A) NEGATIVE   Bilirubin Urine NEGATIVE NEGATIVE   Ketones, ur NEGATIVE NEGATIVE mg/dL   Protein, ur 30 (A) NEGATIVE mg/dL   Nitrite NEGATIVE NEGATIVE   Leukocytes,Ua TRACE (A) NEGATIVE   RBC / HPF >50 (H) 0 - 5 RBC/hpf   WBC, UA 21-50 0 - 5 WBC/hpf   Bacteria, UA RARE (A) NONE SEEN   Squamous Epithelial / LPF 0-5 0 - 5  hCG, quantitative, pregnancy     Status: Abnormal   Collection Time: 02/24/19  9:39 PM  Result Value Ref Range   hCG, Beta Chain, Quant, S 14 (H) <5 mIU/mL  ABO/Rh     Status: None  Collection Time: 02/24/19  9:39 PM  Result Value Ref Range   ABO/RH(D) A POS    No rh immune globuloin      NOT A RH IMMUNE GLOBULIN CANDIDATE, PT RH POSITIVE Performed at [facility], [address] [place], [place] [id number]     --/--/A POS (11/24 2139)  IMAGING No results found.  MAU Management/MDM: Ordered usual first trimester r/o ectopic labs.   Pelvic exam and cultures done Will not check baseline Ultrasound to rule out ectopic, since HCG is so low.  If it rises over time, will get US.  Consult Dr Adrian BlackwaterStinson with presentation, exam findings, and results.    This bleeding/pain can represent a normal pregnancy with bleeding, spontaneous abortion or even an ectopic which can be life-threatening.  The process as listed above helps to determine which of these is present.  Discussed results with patient. SHe has an appointment at office on Monday.  Advised her this likely represents a spontaneous abortion since prior UPTs were positive and now negative, and HCG is so low.  However we do recommend rechecking HCG in 48 hours to see if it doubles.   ASSESSMENT Pregnancy at 6341w4d by LMP Bleeding in early pregnancy Pain in early pregnancy Threatened Abortion  PLAN Discharge home Plan to repeat HCG level in 48  hours Will repeat  Ultrasound in about 7-10 days if HCG levels double appropriately  Ectopic precautions  Pt stable at time of discharge. Encouraged to return here or to other Urgent Care/ED if she develops worsening of symptoms, increase in pain, fever, or other concerning symptoms.    Wynelle BourgeoisMarie Kiasha Bellin CNM, MSN Certified Nurse-Midwife 02/24/2019  11:13 PM

## 2019-02-25 NOTE — MAU Note (Signed)
Ok per Hansel Feinstein CNM to cancel wet prep, GC/Chlamydia probe sent as ordered.

## 2019-02-27 LAB — GC/CHLAMYDIA PROBE AMP (~~LOC~~) NOT AT ARMC
Chlamydia: NEGATIVE
Comment: NEGATIVE
Comment: NORMAL
Neisseria Gonorrhea: NEGATIVE

## 2019-04-03 NOTE — L&D Delivery Note (Signed)
Delivery Note At 3:08 AM a viable female was delivered via Vaginal, Spontaneous (Presentation: Right Occiput Anterior).  APGAR: 8, 9; weight pending .  No shoulder dystocia. Placenta status: Spontaneous, Intact.  Cord: 3 vessels with the following complications: None.  Cord pH: n/a  Rectal exam revealed an intact sphincter and perineum.  Labial lacerations extended into the vagina bilaterally.  Left labial laceration was periclitoral and periurethral.  Red rubber catheter was inserted under sterile technique during the repair.  10 ml urine returned.  1st degree repaired along with hymenal ring tear.  At the end of the repair, fundal massage revealed a boggy tender uterus.  Cytotec 800 mcg placed rectally.  Continue Unasyn x 4 doses.  Placenta sent to path to confirm chorioamnionitis.  Anesthesia: Epidural, Lidocaine Episiotomy: None Lacerations: Bilateral Labial, left labial extended to Periurethral area, 1st degree Suture Repair: 2.0 3.0 chromic Est. Blood Loss (mL):  350  Mom to postpartum.  Baby to Couplet care / Skin to Skin. Couple desires circumcision.  Couple consented in room.  Geryl Rankins 01/31/2020, 4:06 AM

## 2019-07-02 LAB — OB RESULTS CONSOLE RUBELLA ANTIBODY, IGM: Rubella: IMMUNE

## 2019-07-02 LAB — OB RESULTS CONSOLE HIV ANTIBODY (ROUTINE TESTING): HIV: NONREACTIVE

## 2019-07-02 LAB — OB RESULTS CONSOLE ANTIBODY SCREEN: Antibody Screen: NEGATIVE

## 2019-07-02 LAB — OB RESULTS CONSOLE ABO/RH: RH Type: POSITIVE

## 2019-07-02 LAB — OB RESULTS CONSOLE RPR: RPR: NONREACTIVE

## 2019-07-02 LAB — OB RESULTS CONSOLE HEPATITIS B SURFACE ANTIGEN: Hepatitis B Surface Ag: NEGATIVE

## 2019-07-13 LAB — OB RESULTS CONSOLE GC/CHLAMYDIA
Chlamydia: NEGATIVE
Gonorrhea: NEGATIVE

## 2019-08-24 ENCOUNTER — Other Ambulatory Visit: Payer: Self-pay

## 2019-11-24 ENCOUNTER — Other Ambulatory Visit: Payer: Self-pay | Admitting: Obstetrics and Gynecology

## 2019-11-24 DIAGNOSIS — O3663X0 Maternal care for excessive fetal growth, third trimester, not applicable or unspecified: Secondary | ICD-10-CM

## 2019-11-26 ENCOUNTER — Ambulatory Visit: Payer: 59

## 2019-11-26 ENCOUNTER — Other Ambulatory Visit: Payer: Self-pay | Admitting: *Deleted

## 2019-11-26 ENCOUNTER — Ambulatory Visit: Payer: 59 | Attending: Obstetrics and Gynecology

## 2019-11-26 ENCOUNTER — Other Ambulatory Visit: Payer: Self-pay

## 2019-11-26 DIAGNOSIS — O359XX0 Maternal care for (suspected) fetal abnormality and damage, unspecified, not applicable or unspecified: Secondary | ICD-10-CM

## 2019-11-26 DIAGNOSIS — Z363 Encounter for antenatal screening for malformations: Secondary | ICD-10-CM | POA: Diagnosis not present

## 2019-11-26 DIAGNOSIS — O3663X0 Maternal care for excessive fetal growth, third trimester, not applicable or unspecified: Secondary | ICD-10-CM | POA: Insufficient documentation

## 2019-11-26 DIAGNOSIS — Z3A28 28 weeks gestation of pregnancy: Secondary | ICD-10-CM | POA: Diagnosis not present

## 2019-11-26 DIAGNOSIS — O3660X0 Maternal care for excessive fetal growth, unspecified trimester, not applicable or unspecified: Secondary | ICD-10-CM

## 2019-12-16 ENCOUNTER — Other Ambulatory Visit: Payer: Self-pay

## 2019-12-16 ENCOUNTER — Encounter: Payer: 59 | Attending: Obstetrics and Gynecology | Admitting: Registered"

## 2019-12-16 DIAGNOSIS — O24419 Gestational diabetes mellitus in pregnancy, unspecified control: Secondary | ICD-10-CM | POA: Insufficient documentation

## 2019-12-18 ENCOUNTER — Encounter: Payer: Self-pay | Admitting: Registered"

## 2019-12-18 DIAGNOSIS — O24419 Gestational diabetes mellitus in pregnancy, unspecified control: Secondary | ICD-10-CM | POA: Insufficient documentation

## 2019-12-18 NOTE — Progress Notes (Signed)
Patient was seen on 12/16/19 for Gestational Diabetes self-management class at the [facility]. The following learning objectives were met by the patient during this course:   States the definition of Gestational Diabetes  States why dietary management is important in controlling blood glucose  Describes the effects each nutrient has on blood glucose levels  Demonstrates ability to create a balanced meal plan  Demonstrates carbohydrate counting   States when to check blood glucose levels  Demonstrates proper blood glucose monitoring techniques  States the effect of stress and exercise on blood glucose levels  States the importance of limiting caffeine and abstaining from alcohol and smoking  Blood glucose monitor given: None Patient obtained glucometer prior to class.  Patient instructed to monitor glucose levels: FBS: 60 - <95; 1 hour: <140; 2 hour: <120  Patient received handouts:  Nutrition Diabetes and Pregnancy, including carb counting list  Patient will be seen for follow-up as needed. 
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
## 2020-01-12 ENCOUNTER — Other Ambulatory Visit: Payer: Self-pay

## 2020-01-12 ENCOUNTER — Ambulatory Visit: Payer: 59 | Attending: Obstetrics and Gynecology

## 2020-01-12 DIAGNOSIS — Z362 Encounter for other antenatal screening follow-up: Secondary | ICD-10-CM | POA: Diagnosis not present

## 2020-01-12 DIAGNOSIS — O3660X0 Maternal care for excessive fetal growth, unspecified trimester, not applicable or unspecified: Secondary | ICD-10-CM | POA: Diagnosis not present

## 2020-01-12 DIAGNOSIS — Z3A35 35 weeks gestation of pregnancy: Secondary | ICD-10-CM

## 2020-01-28 ENCOUNTER — Other Ambulatory Visit: Payer: Self-pay

## 2020-01-28 ENCOUNTER — Encounter (HOSPITAL_COMMUNITY): Payer: Self-pay | Admitting: Obstetrics and Gynecology

## 2020-01-28 ENCOUNTER — Inpatient Hospital Stay (HOSPITAL_COMMUNITY)
Admission: AD | Admit: 2020-01-28 | Discharge: 2020-02-02 | DRG: 805 | Disposition: A | Discharge status: Home / Self Care | Payer: 59 | Attending: Obstetrics and Gynecology | Admitting: Obstetrics and Gynecology

## 2020-01-28 DIAGNOSIS — O9912 Other diseases of the blood and blood-forming organs and certain disorders involving the immune mechanism complicating childbirth: Secondary | ICD-10-CM | POA: Diagnosis present

## 2020-01-28 DIAGNOSIS — O99214 Obesity complicating childbirth: Secondary | ICD-10-CM | POA: Diagnosis present

## 2020-01-28 DIAGNOSIS — O9952 Diseases of the respiratory system complicating childbirth: Secondary | ICD-10-CM | POA: Diagnosis present

## 2020-01-28 DIAGNOSIS — O24425 Gestational diabetes mellitus in childbirth, controlled by oral hypoglycemic drugs: Secondary | ICD-10-CM | POA: Diagnosis present

## 2020-01-28 DIAGNOSIS — Z3A37 37 weeks gestation of pregnancy: Secondary | ICD-10-CM

## 2020-01-28 DIAGNOSIS — O3663X0 Maternal care for excessive fetal growth, third trimester, not applicable or unspecified: Secondary | ICD-10-CM | POA: Diagnosis present

## 2020-01-28 DIAGNOSIS — J45909 Unspecified asthma, uncomplicated: Secondary | ICD-10-CM | POA: Diagnosis present

## 2020-01-28 DIAGNOSIS — O134 Gestational [pregnancy-induced] hypertension without significant proteinuria, complicating childbirth: Secondary | ICD-10-CM | POA: Diagnosis present

## 2020-01-28 DIAGNOSIS — O139 Gestational [pregnancy-induced] hypertension without significant proteinuria, unspecified trimester: Secondary | ICD-10-CM | POA: Diagnosis present

## 2020-01-28 DIAGNOSIS — O411230 Chorioamnionitis, third trimester, not applicable or unspecified: Secondary | ICD-10-CM | POA: Diagnosis present

## 2020-01-28 DIAGNOSIS — O1414 Severe pre-eclampsia complicating childbirth: Principal | ICD-10-CM | POA: Diagnosis present

## 2020-01-28 DIAGNOSIS — D6959 Other secondary thrombocytopenia: Secondary | ICD-10-CM | POA: Diagnosis present

## 2020-01-28 LAB — PROTEIN / CREATININE RATIO, URINE
Creatinine, Urine: 157.58 mg/dL
Protein Creatinine Ratio: 0.15 mg/mg{Cre} (ref 0.00–0.15)
Total Protein, Urine: 23 mg/dL

## 2020-01-28 LAB — CBC
HCT: 39.1 % (ref 36.0–46.0)
Hemoglobin: 12.9 g/dL (ref 12.0–15.0)
MCH: 30.5 pg (ref 26.0–34.0)
MCHC: 33 g/dL (ref 30.0–36.0)
MCV: 92.4 fL (ref 80.0–100.0)
Platelets: 171 10*3/uL (ref 150–400)
RBC: 4.23 MIL/uL (ref 3.87–5.11)
RDW: 14.2 % (ref 11.5–15.5)
WBC: 9.7 10*3/uL (ref 4.0–10.5)
nRBC: 0 % (ref 0.0–0.2)

## 2020-01-28 LAB — COMPREHENSIVE METABOLIC PANEL
ALT: 13 U/L (ref 0–44)
AST: 24 U/L (ref 15–41)
Albumin: 3.1 g/dL — ABNORMAL LOW (ref 3.5–5.0)
Alkaline Phosphatase: 136 U/L — ABNORMAL HIGH (ref 38–126)
Anion gap: 9 (ref 5–15)
BUN: 7 mg/dL (ref 6–20)
CO2: 21 mmol/L — ABNORMAL LOW (ref 22–32)
Calcium: 8.9 mg/dL (ref 8.9–10.3)
Chloride: 108 mmol/L (ref 98–111)
Creatinine, Ser: 0.68 mg/dL (ref 0.44–1.00)
GFR, Estimated: >60 mL/min (ref >60)
Glucose, Bld: 87 mg/dL (ref 70–99)
Potassium: 4.1 mmol/L (ref 3.5–5.1)
Sodium: 138 mmol/L (ref 135–145)
Total Bilirubin: 0.8 mg/dL (ref 0.3–1.2)
Total Protein: 5.9 g/dL — ABNORMAL LOW (ref 6.5–8.1)

## 2020-01-28 LAB — GLUCOSE, CAPILLARY: Glucose-Capillary: 128 mg/dL — ABNORMAL HIGH (ref 70–99)

## 2020-01-28 LAB — TYPE AND SCREEN
ABO/RH(D): A POS
Antibody Screen: NEGATIVE

## 2020-01-28 LAB — OB RESULTS CONSOLE GBS: GBS: NEGATIVE

## 2020-01-28 MED ORDER — OXYCODONE-ACETAMINOPHEN 5-325 MG PO TABS: 1.0000 | ORAL_TABLET | ORAL | Status: DC | PRN

## 2020-01-28 MED ORDER — MISOPROSTOL 25 MCG QUARTER TABLET
25.0000 ug | ORAL_TABLET | ORAL | Status: DC | PRN
  Administered 2020-01-28 (×2): 25 ug via VAGINAL
  Filled 2020-01-28 (×2): qty 1

## 2020-01-28 MED ORDER — ONDANSETRON HCL 4 MG/2ML IJ SOLN
4.0000 mg | Freq: Four times a day (QID) | INTRAMUSCULAR | Status: DC | PRN
  Administered 2020-01-29 – 2020-01-30 (×4): 4 mg via INTRAVENOUS
  Filled 2020-01-28 (×4): qty 2

## 2020-01-28 MED ORDER — LACTATED RINGERS IV SOLN: 500.0000 mL | INTRAVENOUS | Status: DC | PRN

## 2020-01-28 MED ORDER — TERBUTALINE SULFATE 1 MG/ML IJ SOLN: 0.2500 mg | Freq: Once | INTRAMUSCULAR | Status: DC | PRN

## 2020-01-28 MED ORDER — OXYTOCIN BOLUS FROM INFUSION
333.0000 mL | Freq: Once | INTRAVENOUS | Status: AC
  Administered 2020-01-31: 333 mL via INTRAVENOUS

## 2020-01-28 MED ORDER — OXYTOCIN-SODIUM CHLORIDE 30-0.9 UT/500ML-% IV SOLN
2.5000 [IU]/h | INTRAVENOUS | Status: DC
  Administered 2020-01-31: 2.5 [IU]/h via INTRAVENOUS
  Filled 2020-01-28: qty 500

## 2020-01-28 MED ORDER — OXYCODONE-ACETAMINOPHEN 5-325 MG PO TABS: 2.0000 | ORAL_TABLET | ORAL | Status: DC | PRN

## 2020-01-28 MED ORDER — LIDOCAINE HCL (PF) 1 % IJ SOLN
30.0000 mL | INTRAMUSCULAR | Status: AC | PRN
  Administered 2020-01-31: 30 mL via SUBCUTANEOUS
  Filled 2020-01-28: qty 30

## 2020-01-28 MED ORDER — FENTANYL CITRATE (PF) 100 MCG/2ML IJ SOLN
50.0000 ug | INTRAMUSCULAR | Status: DC | PRN
  Administered 2020-01-29 (×3): 100 ug via INTRAVENOUS
  Filled 2020-01-28 (×3): qty 2

## 2020-01-28 MED ORDER — SOD CITRATE-CITRIC ACID 500-334 MG/5ML PO SOLN
30.0000 mL | ORAL | Status: DC | PRN
  Filled 2020-01-28: qty 15

## 2020-01-28 MED ORDER — HYDROXYZINE HCL 50 MG PO TABS: 50.0000 mg | ORAL_TABLET | Freq: Four times a day (QID) | ORAL | Status: DC | PRN

## 2020-01-28 MED ORDER — ACETAMINOPHEN 325 MG PO TABS
650.0000 mg | ORAL_TABLET | ORAL | Status: DC | PRN
  Administered 2020-01-29 – 2020-01-31 (×3): 650 mg via ORAL
  Filled 2020-01-28 (×3): qty 2

## 2020-01-28 MED ORDER — LACTATED RINGERS IV SOLN: INTRAVENOUS | Status: DC

## 2020-01-28 NOTE — H&P (Signed)
Kimberly Hunter is a 24 y.o. female G2P0010 at 37 wks and 6 days  presenting for induction of labor due to gestational hypertension. Pt was seen in the office today and her blood pressure was 142/80's last week bp was 142/80's . She reports seeing floater this morning. No headache no current floater, no ruq pain. Pregnancy complicated by lga fetus EFW on 01/12/2020 was 7 lb 9 oz greater than 95%ile) .  Pt was diagnosed with gestational diabetes by Dr. Steva Ready. She is on metformin 500 mg daily. Her bs are well controlled. Prenatal care provided by Dr. Steva Ready and Dr. Gerald Leitz  With [facility].   OB History    Gravida  2   Para      Term      Preterm      AB  1   Living        SAB  1   TAB      Ectopic      Multiple      Live Births             Past Medical History:  Diagnosis Date  . Anxiety   . Asthma    last used inhaler in february  . History of PCOS   . IBS (irritable bowel syndrome)    Past Surgical History:  Procedure Laterality Date  . WISDOM TOOTH EXTRACTION     Family History: family history includes Diabetes in her mother and sister; Hypercholesterolemia in her mother; Hypertension in her father and mother. Social History:  reports that she has never smoked. She has never used smokeless tobacco. She reports previous alcohol use. She reports that she does not use drugs.     Maternal Diabetes: Yes:  Diabetes Type:  Insulin/Medication controlled Genetic Screening: Normal Maternal Ultrasounds/Referrals: Normal Fetal Ultrasounds or other Referrals:  None Maternal Substance Abuse:  No Significant Maternal Medications:  Meds include: Other:  Significant Maternal Lab Results:  Group B Strep negative Other Comments:  None  Review of Systems  Constitutional: Negative.   HENT: Negative.   Eyes: Negative.   Respiratory: Negative.   Cardiovascular: Negative.   Gastrointestinal: Negative.   Endocrine: Negative.   Genitourinary:  Negative.   Musculoskeletal: Negative.   Skin: Negative.   Allergic/Immunologic: Negative.   Neurological: Negative.   Hematological: Negative.   Psychiatric/Behavioral: Negative.    History   Blood pressure 133/83, pulse 76, temperature 98.3 F (36.8 C), temperature source Oral, resp. rate 18, height 5\' 7"  (1.702 m), weight 104.4 kg, last menstrual period 05/08/2019, unknown if currently breastfeeding. Maternal Exam:  Introitus: Normal vulva.   Physical Exam Vitals reviewed.  Constitutional:      Appearance: Normal appearance.  HENT:     Head: Normocephalic and atraumatic.     Nose: Nose normal.     Mouth/Throat:     Mouth: Mucous membranes are moist.  Eyes:     Pupils: Pupils are equal, round, and reactive to light.  Cardiovascular:     Rate and Rhythm: Normal rate and regular rhythm.  Pulmonary:     Effort: Pulmonary effort is normal.     Breath sounds: Normal breath sounds.  Abdominal:     Tenderness: There is no abdominal tenderness.  Genitourinary:    General: Normal vulva.  Musculoskeletal:        General: Swelling present. Normal range of motion.     Cervical back: Normal range of motion and neck supple.  Skin:  General: Skin is warm and dry.  Neurological:     General: No focal deficit present.     Mental Status: She is alert and oriented to person, place, and time.  Psychiatric:        Mood and Affect: Mood normal.        Behavior: Behavior normal.     Prenatal labs: ABO, Rh: --/--/A POS (10/28 1615) Antibody: NEG (10/28 1615) Rubella: Immune (04/01 0000) RPR: Nonreactive (04/01 0000)  HBsAg: Negative (04/01 0000)  HIV: Non-reactive (04/01 0000)  GBS:   Negative  Assessment/Plan: 37 wks and 6 days with gestational hypertension for induction  - Cytotec for cervical ripening  GBS negative  Category 1 tracing.  Gestational Diabetes A2- cbg every 4 hours  [facility] MW and DR. Roberts covering at 7 pm ...  Dr. Connye Burkitt will assume care at 7am on  01/29/2020   Gerald Leitz 01/28/2020, 6:25 PM

## 2020-01-28 NOTE — Progress Notes (Signed)
As a part of patient's occupation, she is required to be COVID tested every week. Patient was last tested Monday, October 25th with results available on Wednesday, October 27th. Patient was negative at that time with results from [facility] by Dr. Gillian Shields. Patient has copy of lab results on her cell phone.

## 2020-01-29 ENCOUNTER — Inpatient Hospital Stay (HOSPITAL_COMMUNITY): Payer: 59 | Admitting: Anesthesiology

## 2020-01-29 LAB — GLUCOSE, CAPILLARY
Glucose-Capillary: 75 mg/dL (ref 70–99)
Glucose-Capillary: 72 mg/dL (ref 70–99)
Glucose-Capillary: 81 mg/dL (ref 70–99)
Glucose-Capillary: 73 mg/dL (ref 70–99)
Glucose-Capillary: 61 mg/dL — ABNORMAL LOW (ref 70–99)
Glucose-Capillary: 71 mg/dL (ref 70–99)

## 2020-01-29 LAB — CBC
HCT: 38.2 % (ref 36.0–46.0)
Hemoglobin: 12.3 g/dL (ref 12.0–15.0)
MCH: 30 pg (ref 26.0–34.0)
MCHC: 32.2 g/dL (ref 30.0–36.0)
MCV: 93.2 fL (ref 80.0–100.0)
Platelets: 167 10*3/uL (ref 150–400)
RBC: 4.1 MIL/uL (ref 3.87–5.11)
RDW: 14.3 % (ref 11.5–15.5)
WBC: 11.7 10*3/uL — ABNORMAL HIGH (ref 4.0–10.5)
nRBC: 0 % (ref 0.0–0.2)

## 2020-01-29 LAB — RPR: RPR Ser Ql: NONREACTIVE

## 2020-01-29 MED ORDER — TERBUTALINE SULFATE 1 MG/ML IJ SOLN: 0.2500 mg | Freq: Once | INTRAMUSCULAR | Status: DC | PRN

## 2020-01-29 MED ORDER — FENTANYL-BUPIVACAINE-NACL 0.5-0.125-0.9 MG/250ML-% EP SOLN
12.0000 mL/h | EPIDURAL | Status: DC | PRN
  Administered 2020-01-30: 12 mL/h via EPIDURAL
  Filled 2020-01-29 (×3): qty 250

## 2020-01-29 MED ORDER — DIPHENHYDRAMINE HCL 50 MG/ML IJ SOLN: 12.5000 mg | INTRAMUSCULAR | Status: DC | PRN

## 2020-01-29 MED ORDER — SODIUM CHLORIDE (PF) 0.9 % IJ SOLN
INTRAMUSCULAR | Status: DC | PRN
  Administered 2020-01-29: 12 mL/h via EPIDURAL

## 2020-01-29 MED ORDER — LACTATED RINGERS IV SOLN: 500.0000 mL | Freq: Once | INTRAVENOUS | Status: DC

## 2020-01-29 MED ORDER — EPHEDRINE 5 MG/ML INJ: 10.0000 mg | INTRAVENOUS | Status: DC | PRN

## 2020-01-29 MED ORDER — PHENYLEPHRINE 40 MCG/ML (10ML) SYRINGE FOR IV PUSH (FOR BLOOD PRESSURE SUPPORT): 80.0000 ug | PREFILLED_SYRINGE | INTRAVENOUS | Status: DC | PRN

## 2020-01-29 MED ORDER — FENTANYL-BUPIVACAINE-NACL 0.5-0.125-0.9 MG/250ML-% EP SOLN: 12.0000 mL/h | EPIDURAL | Status: DC | PRN

## 2020-01-29 MED ORDER — PHENYLEPHRINE 40 MCG/ML (10ML) SYRINGE FOR IV PUSH (FOR BLOOD PRESSURE SUPPORT)
80.0000 ug | PREFILLED_SYRINGE | INTRAVENOUS | Status: DC | PRN
  Filled 2020-01-29: qty 10

## 2020-01-29 MED ORDER — OXYTOCIN-SODIUM CHLORIDE 30-0.9 UT/500ML-% IV SOLN
1.0000 m[IU]/min | INTRAVENOUS | Status: DC
  Administered 2020-01-29: 2 m[IU]/min via INTRAVENOUS
  Administered 2020-01-30: 4 m[IU]/min via INTRAVENOUS
  Filled 2020-01-29: qty 500

## 2020-01-29 MED ORDER — LIDOCAINE HCL (PF) 1 % IJ SOLN
INTRAMUSCULAR | Status: DC | PRN
  Administered 2020-01-29: 6 mL via EPIDURAL

## 2020-01-29 NOTE — Progress Notes (Signed)
OB Progress Note  S: Pt reports shaking s/p epidural.  Cervical exam is less painful.  Offgoing RN suspected OP presentation.  Pt has been in multiple positions to help with fetal rotation and descent.  O: BP (!) 117/93   Pulse 95   Temp (!) 97.2 F (36.2 C) (Axillary)   Resp 16   Ht 5\' 7"  (1.702 m)   Wt 104.4 kg   LMP 05/08/2019   SpO2 99%   BMI 36.04 kg/m   Gen:  NAD, Dry heaves Abd:  No upper or RUQ abdominal pain Neuro:  1+ DTR  FHT: 120s-130bpm, moderate variablity, accelerations, tracing intermittent at times. Toco: q2-5 minutes, difficulty tracing per RN  Dilation: 4 Effacement (%): 30 Cervical Position: Middle, Anterior Station: -3 Presentation: Vertex Exam by:: Davis,RN  Pitocin at 16 mUs  CMP Latest Ref Rng & Units 01/28/2020 04/27/2018  Glucose 70 - 99 mg/dL 87 04/29/2018)  BUN 6 - 20 mg/dL 7 9  Creatinine 532(D - 1.00 mg/dL 9.24 2.68  Sodium 3.41 - 145 mmol/L 138 139  Potassium 3.5 - 5.1 mmol/L 4.1 3.8  Chloride 98 - 111 mmol/L 108 105  CO2 22 - 32 mmol/L 21(L) 25  Calcium 8.9 - 10.3 mg/dL 8.9 9.2  Total Protein 6.5 - 8.1 g/dL 5.9(L) -  Total Bilirubin 0.3 - 1.2 mg/dL 0.8 -  Alkaline Phos 38 - 126 U/L 136(H) -  AST 15 - 41 U/L 24 -  ALT 0 - 44 U/L 13 -    CBC Latest Ref Rng & Units 01/29/2020 01/28/2020 04/27/2018  WBC 4.0 - 10.5 K/uL 11.7(H) 9.7 5.6  Hemoglobin 12.0 - 15.0 g/dL 04/29/2018 22.9 79.8  Hematocrit 36 - 46 % 38.2 39.1 42.1  Platelets 150 - 400 K/uL 167 171 204    CBG (last 3)  Recent Labs    01/29/20 1418 01/29/20 1828 01/29/20 1858  GLUCAP 73 61* 71       A/P: 24 y.o. G2P0010 @ [redacted]w[redacted]d for gestational HTN.  Pregnancy complicated by A1DM and suspected fetal macrosomia.  IOL - gestational HTN - FWB: Cat. I - Labor: S/p Cytotec x 2, Foley bulb in place, now on Pitocin.  Await fetal descent to AROM or if IUPC needed. - Pain: Epidural - GBS: Negative - Bps normal to mild range   A1DM -Hypoglycemia x 1.  CBG improved. - Continue CBGs  q4h -Hold Metformin  Suspected fetal macrosomia - EFW 10/12 7lb 9oz (>95%) - Shoulder dystocia precautions at time of delivery  E. 12/12, MD [phone], Pager** [phone], Cell

## 2020-01-29 NOTE — Progress Notes (Addendum)
Labor Progress Note  Kimberly Hunter is a 24 y.o. female G2P0010 at 37 wks and 6 days  presenting for induction of labor due to gestational hypertension. Pt was seen in the office today and her blood pressure was 142/80's last week bp was 142/80's . She reports seeing floater this morning. No headache no current floater, no ruq pain. Pregnancy complicated by lga fetus EFW on 01/12/2020 was 7 lb 9 oz greater than 95%ile) .  Pt was diagnosed with gestational diabetes by Dr. Steva Ready. She is on metformin 500 mg daily. Her bs are well controlled. Prenatal care provided by Dr. Steva Ready and Dr. Gerald Leitz  With [facility].  Subjective: Pt resting well in NAD, feeling cxt.  Patient Active Problem List   Diagnosis Date Noted  . Gestational hypertension 01/28/2020  . Gestational diabetes mellitus (GDM), antepartum 12/18/2019   Objective: BP (!) 141/86   Pulse 74   Temp 97.7 F (36.5 C) (Oral)   Resp 17   Ht 5\' 7"  (1.702 m)   Wt 104.4 kg   LMP 05/08/2019   BMI 36.04 kg/m  No intake/output data recorded. No intake/output data recorded. NST: FHR baseline 130 bpm, Variability: moderate, Accelerations:present, Decelerations:  Absent= Cat 1/Reactive CTX:  irregular, every 1-3 minutes, lasting 40-60 seconds Uterus gravid, soft non tender, moderate to palpate with contractions.  SVE:  Dilation: 0.5 Effacement (%): 60 Station: -3 Exam by:: RN   Assessment:  Kimberly Hunter is a 24 y.o. female G2P0010 at 37 wks and 6 days  presenting for induction of labor due to gestational hypertension. Pt was seen in the office today and her blood pressure was 142/80's last week bp was 142/80's . She reports seeing floater this morning. No headache no current floater, no ruq pain. Pregnancy complicated by lga fetus EFW on 01/12/2020 was 7 lb 9 oz greater than 95%ile) .  Pt was diagnosed with gestational diabetes by Dr. 03/13/2020. She is on metformin 500 mg daily. Her bs are well  controlled. Prenatal care provided by Dr. Steva Ready and Dr. Steva Ready  With [facility]. Pt currently progressing in early labor.  Patient Active Problem List   Diagnosis Date Noted  . Gestational hypertension 01/28/2020  . Gestational diabetes mellitus (GDM), antepartum 12/18/2019   NICHD: Category 1  Membranes:  Intact, no s/s of infection  Induction:    Cytotec x1644, and 2056  Foley Bulb: N/A  Pitocin - N/A  Pain management:               IV pain management: xPRN             Epidural placement: PRN  GBS Negative  A1GDM: stable sugars CBG (last 3)  Recent Labs    01/28/20 1856 01/29/20 0122  GLUCAP 128* 75   Plan: Continue labor plan Continuous monitoring Rest Ambulate Frequent position changes to facilitate fetal rotation and descent. Will reassess with cervical exam at 0500 or earlier if necessary Continue cytotec per protocol Anticipate labor progression and vaginal delivery.   01/31/20, NP-C, CNM, MSN 01/29/2020. 5:01 AM

## 2020-01-29 NOTE — Progress Notes (Signed)
OB Progress Note  S: Pt resting comfortably, no acute complaints.  She has been sleeping.  O: BP 133/86   Pulse 81   Temp 97.7 F (36.5 C) (Oral)   Resp 17   Ht 5\' 7"  (1.702 m)   Wt 104.4 kg   LMP 05/08/2019   BMI 36.04 kg/m   FHT: 130bpm, moderate variablity, 15x15 accels, no decels Toco: q2-4 minutes SVE: deferred, Cook catheter in place  A/P: 24 y.o. G2P0010 @ [redacted]w[redacted]d for gestational HTN.  Pregnancy complicated by A1DM and suspected fetal macrosomia.  IOL - gestational HTN - FWB: Cat. I - Labor: S/p Cytotec x 2, Foley bulb in place (placed at 0500), will likely start low dose pitocin while FB in place - Pain: Per patient requested - GBS: Negative - Bps normal to mild range - HELLP labs negative, PC ratio 0.15  A1DM - 70s-128 - Continue CBGs q4h  Suspected fetal macrosomia - EFW 10/12 7lb 9oz (>95%) - Shoulder dystocia precautions at time of delivery  12/12, DO [phone] (office)

## 2020-01-29 NOTE — Anesthesia Procedure Notes (Signed)
Epidural Patient location during procedure: OB Start time: 01/29/2020 7:27 PM End time: 01/29/2020 7:31 PM  Staffing Anesthesiologist: Bethena Midget, MD  Preanesthetic Checklist Completed: patient identified, IV checked, site marked, risks and benefits discussed, surgical consent, monitors and equipment checked, pre-op evaluation and timeout performed  Epidural Patient position: sitting Prep: DuraPrep and site prepped and draped Patient monitoring: continuous pulse ox and blood pressure Approach: midline Location: L3-L4 Injection technique: LOR air  Needle:  Needle type: Tuohy  Needle gauge: 17 G Needle length: 9 cm and 9 Needle insertion depth: 7 cm Catheter type: closed end flexible Catheter size: 19 Gauge Catheter at skin depth: 12 cm Test dose: negative  Assessment Events: blood not aspirated, injection not painful, no injection resistance, no paresthesia and negative IV test

## 2020-01-29 NOTE — Progress Notes (Signed)
Labor Progress Note  Kimberly Hunter is a 24 y.o. female G2P0010 at 37 wks and 6 days  presenting for induction of labor due to gestational hypertension. Pt was seen in the office today and her blood pressure was 142/80's last week bp was 142/80's . She reports seeing floater this morning. No headache no current floater, no ruq pain. Pregnancy complicated by lga fetus EFW on 01/12/2020 was 7 lb 9 oz greater than 95%ile) .  Pt was diagnosed with gestational diabetes by Dr. Steva Ready. She is on metformin 500 mg daily. Her bs are well controlled. Prenatal care provided by Dr. Steva Ready and Dr. Gerald Leitz  With [facility].  Subjective: Pt resting well in NAD, feeling cxt.  Support husband at bedside. Reviewed safety of Cytotec and how she is having too many cxt to safety continue to give Cytotec, discussed R/B/A of foley bulb, pt verbalized consent.  Patient Active Problem List   Diagnosis Date Noted  . Gestational hypertension 01/28/2020  . Gestational diabetes mellitus (GDM), antepartum 12/18/2019   Objective: BP (!) 141/86   Pulse 74   Temp 97.7 F (36.5 C) (Oral)   Resp 17   Ht 5\' 7"  (1.702 m)   Wt 104.4 kg   LMP 05/08/2019   BMI 36.04 kg/m  No intake/output data recorded. No intake/output data recorded. NST: FHR baseline 130 bpm, Variability: moderate, Accelerations:present, Decelerations:  Absent= Cat 1/Reactive CTX:  irregular, every 1-3 minutes, lasting 40-60 seconds Uterus gravid, soft non tender, moderate to palpate with contractions.  SVE:  Dilation: 1 Effacement (%): 70 Station: -3 Exam by:: [facility] cath placed with ease, pt tolerated well, internal balloon filled with [facility] NS Bedside verified vertex.    Assessment:  Kimberly Hunter is a 24 y.o. female G2P0010 at 37 wks and 6 days  presenting for induction of labor due to gestational hypertension. Pt was seen in the office today and her blood pressure was 142/80's last week bp was  142/80's . She reports seeing floater this morning. No headache no current floater, no ruq pain. Pregnancy complicated by lga fetus EFW on 01/12/2020 was 7 lb 9 oz greater than 95%ile) .  Pt was diagnosed with gestational diabetes by Dr. 03/13/2020. She is on metformin 500 mg daily. Her bs are well controlled. Prenatal care provided by Dr. Steva Ready and Dr. Steva Ready  With [facility]. Pt currently progressing in early labor off two Cytotec, and cxt too much for a third, placed foley instead. Patient Active Problem List   Diagnosis Date Noted  . Gestational hypertension 01/28/2020  . Gestational diabetes mellitus (GDM), antepartum 12/18/2019   NICHD: Category 1  Membranes:  Intact, no s/s of infection  Induction:    Cytotec x1644, and 2056  Foley Bulb: inserted 0500 on 10/29.  Pitocin - N/A  Pain management:               IV pain management: xPRN             Epidural placement: PRN  GBS Negative  A1GDM: stable sugars CBG (last 3)  Recent Labs    01/28/20 1856 01/29/20 0122  GLUCAP 128* 75   Plan: Continue labor plan Continuous monitoring Rest Ambulate Frequent position changes to facilitate fetal rotation and descent. Will reassess with cervical exam at 0500 or earlier if necessary D/C cytotec per protocol for too many cxt Foley bulb in place. Anticipate starting pitocin in 2 hours. Anticipate AROM once  regular cxt noted with fetus descent into pelvis.   Anticipate labor progression and vaginal delivery.   DR Connye Burkitt will assume care at 0700.   Dale Hamilton, NP-C, CNM, MSN 01/29/2020. 5:07 AM

## 2020-01-29 NOTE — Anesthesia Preprocedure Evaluation (Signed)
Anesthesia Evaluation  Patient identified by MRN, date of birth, ID band Patient awake    Reviewed: Allergy & Precautions, H&P , NPO status , Patient's Chart, lab work & pertinent test results, reviewed documented beta blocker date and time   Airway Mallampati: I  TM Distance: >3 FB Neck ROM: full    Dental no notable dental hx. (+) Teeth Intact, Dental Advisory Given   Pulmonary asthma ,    Pulmonary exam normal breath sounds clear to auscultation       Cardiovascular hypertension, Normal cardiovascular exam Rhythm:regular Rate:Normal     Neuro/Psych Anxiety negative neurological ROS  negative psych ROS   GI/Hepatic negative GI ROS, Neg liver ROS,   Endo/Other  diabetes, GestationalMorbid obesity  Renal/GU negative Renal ROS  negative genitourinary   Musculoskeletal   Abdominal   Peds  Hematology negative hematology ROS (+)   Anesthesia Other Findings   Reproductive/Obstetrics (+) Pregnancy                             Anesthesia Physical Anesthesia Plan  ASA: III  Anesthesia Plan: Epidural   Post-op Pain Management:    Induction:   PONV Risk Score and Plan:   Airway Management Planned: Natural Airway  Additional Equipment:   Intra-op Plan:   Post-operative Plan:   Informed Consent: I have reviewed the patients History and Physical, chart, labs and discussed the procedure including the risks, benefits and alternatives for the proposed anesthesia with the patient or authorized representative who has indicated his/her understanding and acceptance.     Dental Advisory Given  Plan Discussed with: Anesthesiologist  Anesthesia Plan Comments: (Labs checked- platelets confirmed with RN in room. Fetal heart tracing, per RN, reported to be stable enough for sitting procedure. Discussed epidural, and patient consents to the procedure:  included risk of possible headache,backache,  failed block, allergic reaction, and nerve injury. This patient was asked if she had any questions or concerns before the procedure started.)        Anesthesia Quick Evaluation

## 2020-01-29 NOTE — Progress Notes (Signed)
OB Progress Note  S: No complaints.  Pitocin decreased due to tachysystole.  O: Temp:  [97.2 F (36.2 C)] 97.2 F (36.2 C) (10/29 1558) Pulse Rate:  [67-99] 75 (10/29 2300) Resp:  [16-18] 16 (10/29 1902) BP: (102-137)/(69-112) 130/87 (10/29 2300) SpO2:  [97 %-100 %] 98 % (10/29 2005)  Gen:  NAD, shivering, pt on left side on peanut ball   FHT: 120s-130bpm, moderate variablity, accelerations, tracing intermittent at times. Toco: q2-4 minutes, difficulty tracing per RN  Dilation: 4.5 Effacement (%): 30 Cervical Position: Middle, Anterior Station: -3 Presentation: Vertex Exam by:: Dr. Idamae Schuller Vertex engaged  AROM clear, IUPC placed without difficulty  CMP Latest Ref Rng & Units 01/28/2020 04/27/2018  Glucose 70 - 99 mg/dL 87 559(R)  BUN 6 - 20 mg/dL 7 9  Creatinine 4.16 - 1.00 mg/dL 3.84 5.36  Sodium 468 - 145 mmol/L 138 139  Potassium 3.5 - 5.1 mmol/L 4.1 3.8  Chloride 98 - 111 mmol/L 108 105  CO2 22 - 32 mmol/L 21(L) 25  Calcium 8.9 - 10.3 mg/dL 8.9 9.2  Total Protein 6.5 - 8.1 g/dL 5.9(L) -  Total Bilirubin 0.3 - 1.2 mg/dL 0.8 -  Alkaline Phos 38 - 126 U/L 136(H) -  AST 15 - 41 U/L 24 -  ALT 0 - 44 U/L 13 -    CBC Latest Ref Rng & Units 01/29/2020 01/28/2020 04/27/2018  WBC 4.0 - 10.5 K/uL 11.7(H) 9.7 5.6  Hemoglobin 12.0 - 15.0 g/dL 03.2 12.2 48.2  Hematocrit 36 - 46 % 38.2 39.1 42.1  Platelets 150 - 400 K/uL 167 171 204    CBG (last 3)  CBG (last 3)  Recent Labs    01/29/20 1418 01/29/20 1828 01/29/20 1858  GLUCAP 73 61* 71        A/P: 24 y.o. G2P0010 @ [redacted]w[redacted]d for gestational HTN.  Pregnancy complicated by A1DM and suspected fetal macrosomia.  IOL - gestational HTN - FWB: Cat. I - Labor: S/p Cytotec x 2, Foley bulb in place, now on Pitocin.  S/p AROM and IUPC placed.  Titrate Pitocin until contractions adequate. - Pain: Epidural - GBS: Negative - Bps normal to mild range   A1DM -Hypoglycemia x 1.  CBG improved. - Continue CBGs q4h -Hold  Metformin  Suspected fetal macrosomia - EFW 10/12 7lb 9oz (>95%) - Shoulder dystocia precautions at time of delivery  E. Marylou Flesher, MD [phone], Pager** [phone], Cell

## 2020-01-30 LAB — CBC
HCT: 38.8 % (ref 36.0–46.0)
Hemoglobin: 12.7 g/dL (ref 12.0–15.0)
MCH: 30.5 pg (ref 26.0–34.0)
MCHC: 32.7 g/dL (ref 30.0–36.0)
MCV: 93.3 fL (ref 80.0–100.0)
Platelets: 134 K/uL — ABNORMAL LOW (ref 150–400)
RBC: 4.16 MIL/uL (ref 3.87–5.11)
RDW: 14.3 % (ref 11.5–15.5)
WBC: 12.7 K/uL — ABNORMAL HIGH (ref 4.0–10.5)
nRBC: 0 % (ref 0.0–0.2)

## 2020-01-30 LAB — COMPREHENSIVE METABOLIC PANEL WITH GFR
ALT: 12 U/L (ref 0–44)
AST: 23 U/L (ref 15–41)
Albumin: 2.9 g/dL — ABNORMAL LOW (ref 3.5–5.0)
Alkaline Phosphatase: 154 U/L — ABNORMAL HIGH (ref 38–126)
Anion gap: 12 (ref 5–15)
BUN: <5 mg/dL — ABNORMAL LOW (ref 6–20)
CO2: 20 mmol/L — ABNORMAL LOW (ref 22–32)
Calcium: 8.6 mg/dL — ABNORMAL LOW (ref 8.9–10.3)
Chloride: 107 mmol/L (ref 98–111)
Creatinine, Ser: 0.8 mg/dL (ref 0.44–1.00)
GFR, Estimated: >60 mL/min
Glucose, Bld: 73 mg/dL (ref 70–99)
Potassium: 3.8 mmol/L (ref 3.5–5.1)
Sodium: 139 mmol/L (ref 135–145)
Total Bilirubin: 1 mg/dL (ref 0.3–1.2)
Total Protein: 5.4 g/dL — ABNORMAL LOW (ref 6.5–8.1)

## 2020-01-30 LAB — PROTEIN / CREATININE RATIO, URINE
Creatinine, Urine: 69.78 mg/dL
Protein Creatinine Ratio: 0.99 mg/mg{creat} — ABNORMAL HIGH (ref 0.00–0.15)
Total Protein, Urine: 69 mg/dL

## 2020-01-30 LAB — GLUCOSE, CAPILLARY
Glucose-Capillary: 58 mg/dL — ABNORMAL LOW (ref 70–99)
Glucose-Capillary: 67 mg/dL — ABNORMAL LOW (ref 70–99)
Glucose-Capillary: 70 mg/dL (ref 70–99)
Glucose-Capillary: 71 mg/dL (ref 70–99)
Glucose-Capillary: 72 mg/dL (ref 70–99)
Glucose-Capillary: 76 mg/dL (ref 70–99)
Glucose-Capillary: 82 mg/dL (ref 70–99)
Glucose-Capillary: 83 mg/dL (ref 70–99)
Glucose-Capillary: 94 mg/dL (ref 70–99)

## 2020-01-30 MED ORDER — SODIUM CHLORIDE 0.9 % IV SOLN
3.0000 g | Freq: Four times a day (QID) | INTRAVENOUS | Status: DC
  Administered 2020-01-30 – 2020-01-31 (×2): 3 g via INTRAVENOUS
  Filled 2020-01-30 (×4): qty 8
  Filled 2020-01-30: qty 3
  Filled 2020-01-30 (×2): qty 8

## 2020-01-30 MED ORDER — PROMETHAZINE HCL 25 MG/ML IJ SOLN
12.5000 mg | INTRAMUSCULAR | Status: DC | PRN
  Administered 2020-01-30: 12.5 mg via INTRAVENOUS
  Filled 2020-01-30: qty 1

## 2020-01-30 NOTE — Progress Notes (Signed)
Pt comfortable. Alert.  Denies headaches or visual changes.  BP (!) 142/82   Pulse (!) 103   Temp 100 F (37.8 C) (Axillary)   Resp 18   Ht 5\' 7"  (1.702 m)   Wt 104.4 kg   LMP 05/08/2019   SpO2 97%   BMI 36.04 kg/m    Gen:  NAD, Alert Dilation: 10 Dilation Complete Date: 01/30/20 Dilation Complete Time: 2245 Effacement (%): 100 Cervical Position: Middle, Anterior Station: 0 (caput) Presentation: Vertex Exam by:: Dr. [email or web address]   EM:  140s, moderate variability, accelerations present, no decelerations Contractions: q 2-6; MVUs 110s-120s  A/P IUP @ 38 1/7 weeks Protracted labor.    -Allow to labor down.  Discussed with pt and family, fetal descent needed to start pushing.    Reviewed risk of postpartum hemorrhage due to uterine atony, difficulty with delivery if via c-section.  Will not do vacuum assisted delivery. Prolonged ROM  Pt's temperature is increasing.  No fetal tachycardia.  Start Unasyn. Preeclampsia  BP normal to mild.  Reassess in 2 hours.  Start pushing when +1 to +2.

## 2020-01-30 NOTE — Progress Notes (Signed)
Hypoglycemic Event  CBG: 67  Treatment: 4 oz juice/soda  Symptoms: None  Follow-up CBG: Time:1543 CBG Result:71  Possible Reasons for Event: Inadequate meal intake  Comments/MD notified:1505    Chipper Herb, Toney Sang

## 2020-01-30 NOTE — Progress Notes (Signed)
OB Progress Note  Overnight, late decelerations noted despite resuscitation.  Pitocin stopped x 1 hour.  Restarted Pitocin.  S: Pt c/o dry mouth.  Has vomiting with po intake.  Also has had dry heaves.  Pt reports she had n/v until 7 months of pregnancy.  Denies headaches.  Saw a few spots this morning, also had difficulty focusing but none now.    O: Temp:  [97.2 F (36.2 C)-98.2 F (36.8 C)] 97.2 F (36.2 C) (10/30 0801) Pulse Rate:  [67-108] 86 (10/30 1030) Resp:  [16-20] 20 (10/30 1030) BP: (102-143)/(50-112) 133/68 (10/30 1030) SpO2:  [96 %-100 %] 97 % (10/30 0330)   I/O last 3 completed shifts: In: -  Out: 1200 [Urine:1200] Total I/O In: -  Out: 600 [Urine:600]    Gen:  Appears to be in severe pain but it resolves during my time in the room. Abd:  No upper or RUQ pain.   Neuro:  DTRs not illicited Ext:  Edematous bilaterally   FHT: 130s 140s bpm, moderate variablity, accelerations, tracing intermittent at times. Toco: q2-4 minutes, difficulty tracing per RN  Dilation: 4.5 Effacement (%): 100 Cervical Position: Middle, Anterior Station: 0 Presentation: Vertex Exam by:: dr Dion Body     CMP Latest Ref Rng & Units 01/28/2020 04/27/2018  Glucose 70 - 99 mg/dL 87 401(U)  BUN 6 - 20 mg/dL 7 9  Creatinine 2.72 - 1.00 mg/dL 5.36 6.44  Sodium 034 - 145 mmol/L 138 139  Potassium 3.5 - 5.1 mmol/L 4.1 3.8  Chloride 98 - 111 mmol/L 108 105  CO2 22 - 32 mmol/L 21(L) 25  Calcium 8.9 - 10.3 mg/dL 8.9 9.2  Total Protein 6.5 - 8.1 g/dL 5.9(L) -  Total Bilirubin 0.3 - 1.2 mg/dL 0.8 -  Alkaline Phos 38 - 126 U/L 136(H) -  AST 15 - 41 U/L 24 -  ALT 0 - 44 U/L 13 -    CBC Latest Ref Rng & Units 01/29/2020 01/28/2020 04/27/2018  WBC 4.0 - 10.5 K/uL 11.7(H) 9.7 5.6  Hemoglobin 12.0 - 15.0 g/dL 74.2 59.5 63.8  Hematocrit 36 - 46 % 38.2 39.1 42.1  Platelets 150 - 400 K/uL 167 171 204    CBG (last 3)  CBG (last 3)  Recent Labs    01/30/20 0627 01/30/20 0701  01/30/20 0937  GLUCAP 58* 82 72        A/P: 24 y.o. G2P0010 @ [redacted]w[redacted]d for gestational HTN.  Pregnancy complicated by A1DM and suspected fetal macrosomia.  IOL - gestational HTN - FWB: Cat. I - Labor: Pecola Leisure has come down into the pelvis, cervix is more effaced.  Contractions becoming adequate.  Continue to titrate until MVUs of ~200. - Pain: Epidural in place,  Suspect some discomfort due to fetal descent.  Rotate prn.   - GBS: Negative -BPs now in mild range.  Repeat labs this am. -N/V.  Pt was dry heaving yesterday when BP was normal and not in labor.  Try Phenergan IV 12.5, repeat in 1 hour prn.  If no improvement, start Magnesium Sulfate.   A1DM -Hypoglycemia overnight.  CBG improved.  PO intake once n/v controlled. - Continue CBGs q4h -Hold Metformin  Suspected fetal macrosomia - EFW 10/12 7lb 9oz (>95%) -Adequate pelvis. - Shoulder dystocia precautions at time of delivery  E. Marylou Flesher, MD [phone], Pager** [phone], Cell

## 2020-01-30 NOTE — Progress Notes (Signed)
Hypoglycemic Event  CBG: 58   Treatment: 4 oz of apple juice   Symptoms: None  Follow-up CBG: Time:0704 CBG Result:82        Kimberly Hunter D Kimberly Hunter

## 2020-01-30 NOTE — Progress Notes (Signed)
Tracing reviewed remotely.

## 2020-01-30 NOTE — Progress Notes (Signed)
OB Progress Note   Pitocin discontinued due to late decelerations.  Pt was up to 22 mUs.  S: Pt and family report pt has been sleeping well since Phenergan IV.  N/V has resolved.  Pt feels pain on her right side but o/w has been comfortable.  O: BP 126/79   Pulse (!) 105   Temp 98.2 F (36.8 C) (Oral)   Resp 20   Ht 5\' 7"  (1.702 m)   Wt 104.4 kg   LMP 05/08/2019   SpO2 97%   BMI 36.04 kg/m   UOP: 400 ml/6 hours   Gen:  NAD, Appears somnolent, Pt sitting upright   FHT: 130s-140s bpm, moderate variablity, accelerations Toco: irregular, few  Dilation: 7 Effacement (%): 100 Cervical Position: Middle, Anterior Station: -1, 0 (caput) Presentation: Vertex Exam by:: dr [email or web address]  Pitocin off  CMP Latest Ref Rng & Units 01/30/2020 01/28/2020 04/27/2018  Glucose 70 - 99 mg/dL 73 87 04/29/2018)  BUN 6 - 20 mg/dL 332(R) 7 9  Creatinine <5(J - 1.00 mg/dL 8.84 1.66 0.63  Sodium 135 - 145 mmol/L 139 138 139  Potassium 3.5 - 5.1 mmol/L 3.8 4.1 3.8  Chloride 98 - 111 mmol/L 107 108 105  CO2 22 - 32 mmol/L 20(L) 21(L) 25  Calcium 8.9 - 10.3 mg/dL 0.16) 8.9 9.2  Total Protein 6.5 - 8.1 g/dL 0.1(U) 5.9(L) -  Total Bilirubin 0.3 - 1.2 mg/dL 1.0 0.8 -  Alkaline Phos 38 - 126 U/L 154(H) 136(H) -  AST 15 - 41 U/L 23 24 -  ALT 0 - 44 U/L 12 13 -    CBC Latest Ref Rng & Units 01/30/2020 01/29/2020 01/28/2020  WBC 4.0 - 10.5 K/uL 12.7(H) 11.7(H) 9.7  Hemoglobin 12.0 - 15.0 g/dL 01/30/2020 35.5 73.2  Hematocrit 36 - 46 % 38.8 38.2 39.1  Platelets 150 - 400 K/uL 134(L) 167 171    CBG (last 3)  Recent Labs    01/30/20 0937 01/30/20 1502 01/30/20 1543  GLUCAP 72 67* 76       A/P: 24 y.o. G2P0010 @ [redacted]w[redacted]d for gestational HTN.  Pregnancy complicated by A1DM and suspected fetal macrosomia.  IOL - gestational HTN - FWB: Cat. I currently - Labor: Protracted labor but progressing despite inadequate contractions. Possibly asynclitic.  Resume Pitocin. - Pain management: Epidural - GBS:  Negative - Bps normal currently. -Labs:  Platelets have decreased, LFTs normal -UOP decreased but my be due to fetal descent.  Continue strict I/Os.   A1DM -Hypoglycemia episodes intermittently, responds well to juice.  - Continue CBGs q4h -Hold Metformin  Suspected fetal macrosomia - EFW 10/12 7lb 9oz (>95%) -Adequate pelvis - Shoulder dystocia precautions at time of delivery  E. 12/12, MD [phone], Pager** [phone], Cell

## 2020-01-31 ENCOUNTER — Encounter (HOSPITAL_COMMUNITY): Payer: Self-pay | Admitting: Obstetrics and Gynecology

## 2020-01-31 LAB — CBC
HCT: 35.2 % — ABNORMAL LOW (ref 36.0–46.0)
Hemoglobin: 11.5 g/dL — ABNORMAL LOW (ref 12.0–15.0)
MCH: 30.2 pg (ref 26.0–34.0)
MCHC: 32.7 g/dL (ref 30.0–36.0)
MCV: 92.4 fL (ref 80.0–100.0)
Platelets: 129 10*3/uL — ABNORMAL LOW (ref 150–400)
RBC: 3.81 MIL/uL — ABNORMAL LOW (ref 3.87–5.11)
RDW: 14.3 % (ref 11.5–15.5)
WBC: 16.1 10*3/uL — ABNORMAL HIGH (ref 4.0–10.5)
nRBC: 0 % (ref 0.0–0.2)

## 2020-01-31 MED ORDER — MISOPROSTOL 200 MCG PO TABS: 800.0000 ug | ORAL_TABLET | Freq: Once | ORAL | Status: DC | PRN

## 2020-01-31 MED ORDER — SENNOSIDES-DOCUSATE SODIUM 8.6-50 MG PO TABS
2.0000 | ORAL_TABLET | ORAL | Status: DC
  Administered 2020-01-31 – 2020-02-01 (×2): 2 via ORAL
  Filled 2020-01-31 (×2): qty 2

## 2020-01-31 MED ORDER — BENZOCAINE-MENTHOL 20-0.5 % EX AERO
1.0000 "application " | INHALATION_SPRAY | CUTANEOUS | Status: DC | PRN
  Filled 2020-01-31: qty 56

## 2020-01-31 MED ORDER — ALBUTEROL SULFATE (2.5 MG/3ML) 0.083% IN NEBU: 2.5000 mg | INHALATION_SOLUTION | RESPIRATORY_TRACT | Status: DC | PRN

## 2020-01-31 MED ORDER — SODIUM CHLORIDE 0.9 % IV SOLN
INTRAVENOUS | Status: DC | PRN
  Administered 2020-01-31 – 2020-02-01 (×2): 250 mL via INTRAVENOUS

## 2020-01-31 MED ORDER — ONDANSETRON HCL 4 MG PO TABS: 4.0000 mg | ORAL_TABLET | ORAL | Status: DC | PRN

## 2020-01-31 MED ORDER — DIBUCAINE (PERIANAL) 1 % EX OINT: 1.0000 "application " | TOPICAL_OINTMENT | CUTANEOUS | Status: DC | PRN

## 2020-01-31 MED ORDER — ACETAMINOPHEN 325 MG PO TABS
650.0000 mg | ORAL_TABLET | ORAL | Status: DC | PRN
  Administered 2020-01-31 – 2020-02-02 (×4): 650 mg via ORAL
  Filled 2020-01-31 (×4): qty 2

## 2020-01-31 MED ORDER — IBUPROFEN 600 MG PO TABS
600.0000 mg | ORAL_TABLET | Freq: Four times a day (QID) | ORAL | Status: DC
  Administered 2020-01-31 – 2020-02-02 (×10): 600 mg via ORAL
  Filled 2020-01-31 (×10): qty 1

## 2020-01-31 MED ORDER — OXYCODONE HCL 5 MG PO TABS
5.0000 mg | ORAL_TABLET | ORAL | Status: DC | PRN
  Administered 2020-02-02 (×2): 5 mg via ORAL
  Filled 2020-01-31 (×2): qty 1

## 2020-01-31 MED ORDER — PRENATAL MULTIVITAMIN CH
1.0000 | ORAL_TABLET | Freq: Every day | ORAL | Status: DC
  Administered 2020-01-31 – 2020-02-02 (×3): 1 via ORAL
  Filled 2020-01-31 (×3): qty 1

## 2020-01-31 MED ORDER — SIMETHICONE 80 MG PO CHEW: 80.0000 mg | CHEWABLE_TABLET | ORAL | Status: DC | PRN

## 2020-01-31 MED ORDER — MAGNESIUM HYDROXIDE 400 MG/5ML PO SUSP: 30.0000 mL | ORAL | Status: DC | PRN

## 2020-01-31 MED ORDER — OXYCODONE HCL 5 MG PO TABS: 10.0000 mg | ORAL_TABLET | ORAL | Status: DC | PRN

## 2020-01-31 MED ORDER — DIPHENHYDRAMINE HCL 25 MG PO CAPS: 25.0000 mg | ORAL_CAPSULE | Freq: Four times a day (QID) | ORAL | Status: DC | PRN

## 2020-01-31 MED ORDER — MISOPROSTOL 200 MCG PO TABS
ORAL_TABLET | ORAL | Status: AC
  Administered 2020-01-31: 800 ug via RECTAL
  Filled 2020-01-31: qty 4

## 2020-01-31 MED ORDER — ONDANSETRON HCL 4 MG/2ML IJ SOLN: 4.0000 mg | INTRAMUSCULAR | Status: DC | PRN

## 2020-01-31 MED ORDER — ZOLPIDEM TARTRATE 5 MG PO TABS: 5.0000 mg | ORAL_TABLET | Freq: Every evening | ORAL | Status: DC | PRN

## 2020-01-31 MED ORDER — OXYTOCIN-SODIUM CHLORIDE 30-0.9 UT/500ML-% IV SOLN: 2.5000 [IU]/h | INTRAVENOUS | Status: DC | PRN

## 2020-01-31 MED ORDER — WITCH HAZEL-GLYCERIN EX PADS: 1.0000 "application " | MEDICATED_PAD | CUTANEOUS | Status: DC | PRN

## 2020-01-31 MED ORDER — COCONUT OIL OIL: 1.0000 "application " | TOPICAL_OIL | Status: DC | PRN

## 2020-01-31 MED ORDER — MISOPROSTOL 200 MCG PO TABS: 800.0000 ug | ORAL_TABLET | Freq: Once | ORAL | Status: AC

## 2020-01-31 MED ORDER — ALBUTEROL SULFATE HFA 108 (90 BASE) MCG/ACT IN AERS
1.0000 | INHALATION_SPRAY | RESPIRATORY_TRACT | Status: DC | PRN
Start: 2020-01-31 — End: 2020-01-31

## 2020-01-31 MED ORDER — TETANUS-DIPHTH-ACELL PERTUSSIS 5-2.5-18.5 LF-MCG/0.5 IM SUSY: 0.5000 mL | PREFILLED_SYRINGE | Freq: Once | INTRAMUSCULAR | Status: DC

## 2020-01-31 MED ORDER — SODIUM CHLORIDE 0.9 % IV SOLN
3.0000 g | Freq: Four times a day (QID) | INTRAVENOUS | Status: AC
  Administered 2020-01-31 – 2020-02-01 (×4): 3 g via INTRAVENOUS
  Filled 2020-01-31 (×4): qty 3

## 2020-01-31 NOTE — Lactation Note (Signed)
This note was copied from a baby's chart. Lactation Consultation Note  Patient Name: Boy Raiya Stainback [id number] Date: 01/31/2020  P1, 14 hour ETI female infant, infant with hypoglycemia had glucose gel earlier today. Mom with hx: GDM-metformin, GHTN, PCOS, anxiety and depression. Infant had 2 stools and one void since birth. Per mom, she has two DEBP at home. Per mom, she feels BF is going well, infant is latching 10 to 15 minutes most feedings and she feels a tug when infant is latched.  LC did not see latch, mom finished BF 30 minutes prior to [facility] entering the room. Per mom, she knows how to hand express. Mom understands infant should BF according to cues, 8 to 12+ times within 24 hours, STS. Mom knows to call RN or LC if she needs assistance with latching infant at the breast. Mom made aware of O/P services, breastfeeding support groups, community resources, and our phone # for post-discharge questions.    Maternal Data    Feeding Feeding Type: Breast Milk  LATCH Score Latch: Repeated attempts needed to sustain latch, nipple held in mouth throughout feeding, stimulation needed to elicit sucking reflex.  Audible Swallowing: A few with stimulation  Type of Nipple: Flat  Comfort (Breast/Nipple): Soft / non-tender  Hold (Positioning): Assistance needed to correctly position infant at breast and maintain latch.  LATCH Score: 6  Interventions    Lactation Tools Discussed/Used     Consult Status      Danelle Earthly 01/31/2020, 5:47 PM

## 2020-01-31 NOTE — Anesthesia Postprocedure Evaluation (Signed)
Anesthesia Post Note  Patient: Kimberly Hunter  Procedure(s) Performed: AN AD HOC LABOR EPIDURAL     Patient location during evaluation: Mother Baby Anesthesia Type: Epidural Level of consciousness: awake and alert Pain management: pain level controlled Vital Signs Assessment: post-procedure vital signs reviewed and stable Respiratory status: spontaneous breathing, nonlabored ventilation and respiratory function stable Cardiovascular status: stable Postop Assessment: no headache, no backache and epidural receding Anesthetic complications: no   No complications documented.  Last Vitals:  Vitals:   01/31/20 0601 01/31/20 0640  BP: (!) 151/83 139/84  Pulse: 79 80  Resp:  18  Temp:  37.7 C  SpO2:  98%    Last Pain:  Vitals:   01/31/20 0640  TempSrc: Oral  PainSc:    Pain Goal:                Epidural/Spinal Function Cutaneous sensation: Tingles (01/31/20 0640), Patient able to flex knees: Yes (01/31/20 0640), Patient able to lift hips off bed: Yes (01/31/20 0640), Back pain beyond tenderness at insertion site: No (01/31/20 0640), Progressively worsening motor and/or sensory loss: No (01/31/20 0640), Bowel and/or bladder incontinence post epidural: No (01/31/20 0640)  Micholas Drumwright

## 2020-01-31 NOTE — Progress Notes (Signed)
RN spoke to Dr. Dion Body regarding Cytotec order and update temperature of a 99.3 following a temperature of 101.3 at 0731. Nothing else to be done for the temperature at this time. The order of Cytotec is in place for 800 mg to be given for patient if heavy bleeding occurs.

## 2020-01-31 NOTE — Progress Notes (Signed)
C/C/+2.  Started pushing.  Some fetal descent with pushing. Temp 100.8, S/p Unasyn and Tylenol.

## 2020-01-31 NOTE — Progress Notes (Signed)
In to check on pt. Mother alert and nursing baby.  Appears well BP 131/82   Pulse 72   Temp 98.9 F (37.2 C)   Resp 17   Ht 5\' 7"  (1.702 m)   Wt 104.4 kg   LMP 05/08/2019   SpO2 97%   Breastfeeding Unknown   BMI 36.04 kg/m  Gen:  NAD, mild periorbital edema Abd:  Uterus firm, appropriately tender Ext:  Edematous, mild pitting  A/P S/p SVD Prolonged,protracted labor.  Febrile.   Continue Unasyn x 4 doses  Vital signs q 4 hours. Preeclampsia (by PCR on cath specimen intrapartum)  BP normal, Asymptomatic  Vitals q 4 hours. Routine prenatal care. Inpatient circumcision tomorrow. Dr. 07/06/2019 assuming care at [facility], 02/01/20.

## 2020-01-31 NOTE — Progress Notes (Signed)
Contacted Dr. Dion Body at [phone] regarding elevated temperature of 101.3. Patient received 600 mg ibuprofen at 0730 and is currently receiving IV Unasyn.   Per Dr. Ginnie Smart instructions, patient should have temperature rechecked in one hour, if temperature is still greater than 100.4 patient should receive 1 gram tylenol.   Report given to Sammuel Hines, RN  Veronda Prude, RN

## 2020-02-01 LAB — CBC
HCT: 33.1 % — ABNORMAL LOW (ref 36.0–46.0)
Hemoglobin: 10.8 g/dL — ABNORMAL LOW (ref 12.0–15.0)
MCH: 30.3 pg (ref 26.0–34.0)
MCHC: 32.6 g/dL (ref 30.0–36.0)
MCV: 93 fL (ref 80.0–100.0)
Platelets: 129 10*3/uL — ABNORMAL LOW (ref 150–400)
RBC: 3.56 MIL/uL — ABNORMAL LOW (ref 3.87–5.11)
RDW: 14.4 % (ref 11.5–15.5)
WBC: 7.1 10*3/uL (ref 4.0–10.5)
nRBC: 0 % (ref 0.0–0.2)

## 2020-02-01 LAB — COMPREHENSIVE METABOLIC PANEL
ALT: 14 U/L (ref 0–44)
ALT: 14 U/L (ref 0–44)
AST: 32 U/L (ref 15–41)
AST: 29 U/L (ref 15–41)
Albumin: 2.2 g/dL — ABNORMAL LOW (ref 3.5–5.0)
Albumin: 2.2 g/dL — ABNORMAL LOW (ref 3.5–5.0)
Alkaline Phosphatase: 111 U/L (ref 38–126)
Alkaline Phosphatase: 108 U/L (ref 38–126)
Anion gap: 8 (ref 5–15)
Anion gap: 8 (ref 5–15)
BUN: 12 mg/dL (ref 6–20)
BUN: 9 mg/dL (ref 6–20)
CO2: 22 mmol/L (ref 22–32)
CO2: 23 mmol/L (ref 22–32)
Calcium: 8.2 mg/dL — ABNORMAL LOW (ref 8.9–10.3)
Calcium: 8.1 mg/dL — ABNORMAL LOW (ref 8.9–10.3)
Chloride: 108 mmol/L (ref 98–111)
Chloride: 108 mmol/L (ref 98–111)
Creatinine, Ser: 1.03 mg/dL — ABNORMAL HIGH (ref 0.44–1.00)
Creatinine, Ser: 0.89 mg/dL (ref 0.44–1.00)
GFR, Estimated: >60 mL/min (ref >60)
GFR, Estimated: >60 mL/min (ref >60)
Glucose, Bld: 68 mg/dL — ABNORMAL LOW (ref 70–99)
Glucose, Bld: 90 mg/dL (ref 70–99)
Potassium: 3.8 mmol/L (ref 3.5–5.1)
Potassium: 4.1 mmol/L (ref 3.5–5.1)
Sodium: 138 mmol/L (ref 135–145)
Sodium: 139 mmol/L (ref 135–145)
Total Bilirubin: 0.7 mg/dL (ref 0.3–1.2)
Total Bilirubin: 0.8 mg/dL (ref 0.3–1.2)
Total Protein: 4.6 g/dL — ABNORMAL LOW (ref 6.5–8.1)
Total Protein: 4.6 g/dL — ABNORMAL LOW (ref 6.5–8.1)

## 2020-02-01 LAB — GLUCOSE, CAPILLARY: Glucose-Capillary: 65 mg/dL — ABNORMAL LOW (ref 70–99)

## 2020-02-01 LAB — LACTATE DEHYDROGENASE: LDH: 209 U/L — ABNORMAL HIGH (ref 98–192)

## 2020-02-01 MED ORDER — MAGNESIUM SULFATE BOLUS VIA INFUSION
4.0000 g | Freq: Once | INTRAVENOUS | Status: AC
  Administered 2020-02-01: 4 g via INTRAVENOUS
  Filled 2020-02-01: qty 1000

## 2020-02-01 MED ORDER — LABETALOL HCL 5 MG/ML IV SOLN: 80.0000 mg | INTRAVENOUS | Status: DC | PRN

## 2020-02-01 MED ORDER — HYDRALAZINE HCL 20 MG/ML IJ SOLN: 10.0000 mg | INTRAMUSCULAR | Status: DC | PRN

## 2020-02-01 MED ORDER — LABETALOL HCL 5 MG/ML IV SOLN: 20.0000 mg | INTRAVENOUS | Status: DC | PRN

## 2020-02-01 MED ORDER — MAGNESIUM SULFATE 40 GM/1000ML IV SOLN
2.0000 g/h | INTRAVENOUS | Status: DC
  Administered 2020-02-01 – 2020-02-02 (×2): 2 g/h via INTRAVENOUS
  Filled 2020-02-01 (×2): qty 1000

## 2020-02-01 MED ORDER — LABETALOL HCL 5 MG/ML IV SOLN: 40.0000 mg | INTRAVENOUS | Status: DC | PRN

## 2020-02-01 MED ORDER — LACTATED RINGERS IV SOLN: INTRAVENOUS | Status: DC

## 2020-02-01 NOTE — Lactation Note (Signed)
This note was copied from a baby's chart. Lactation Consultation Note  Patient Name: Kimberly Hunter [id number] Date: 02/01/2020 Reason for consult: Follow-up assessment  Follow up to 40 hours old infant of P1 mother. Mother states baby has been very sleepy today and has hardly feed. Mother states infant was checked for jaundice and blood sugar but results came back normal.  Infant started showing hunger cues and offered assistance with latch football position to left breast. Provided support with pillows. Infant latched well for 5 minutes and unlatched. Noticed a compressed nipple. Re-attempted latch to left breast but pops on and off breast. Talked to mother about using a nipple shield. Mother agreed, demonstrated placing 20 mm NS with some formula inside. Infant able to latch successfully after a couple of attempts. Noted suckling and swallowing. Mother massaged and compressed breast. Discussed good posture and position. Reinforced waking up infant for feedings and feed 8-12 in 24h. Reviewed cluster-feeding and normal behavior after 24 hours.   Promoted maternal rest, hydration and food intake. Encouraged to contact [facility] for support when ready to breastfeed baby and recommended to request help for questions or concerns.    Feeding plan:  1. Breastfeed following hunger cues and keeping infant awake during feedings. 2. Offer breast 8 - 12 times in 24h period using nipple shield as needed for latch.  3. Pump or hand-express and offer EBM prior to formula supplementation. 4. If needed supplement with formula following guidelines, paced bottle feeding and fullness cues.   5. Encouraged maternal rest, hydration and food intake.  6. Contact Lactation Services or local resources for support, questions or concerns.    All questions answered at this time.   Maternal Data Formula Feeding for Exclusion: No Has patient been taught Hand Expression?: Yes  Feeding Feeding Type: Breast Fed  LATCH  Score Latch: Repeated attempts needed to sustain latch, nipple held in mouth throughout feeding, stimulation needed to elicit sucking reflex.  Audible Swallowing: A few with stimulation  Type of Nipple: Everted at rest and after stimulation (short shaft)  Comfort (Breast/Nipple): Soft / non-tender  Hold (Positioning): Assistance needed to correctly position infant at breast and maintain latch.  LATCH Score: 7  Interventions Interventions: Assisted with latch;Skin to skin;Breast massage;Hand express;Adjust position;Support pillows;Position options;DEBP;Expressed milk  Lactation Tools Discussed/Used Tools: Pump;Nipple Shields Nipple shield size: 20 Breast pump type: Double-Electric Breast Pump   Consult Status Consult Status: Follow-up Date: 02/02/20 Follow-up type: In-patient    Saurav Crumble A Higuera Ancidey 02/01/2020, 7:23 PM

## 2020-02-01 NOTE — Progress Notes (Addendum)
Postpartum Note Day # 1  S:  Patient doing well.  Pain controlled.  Tolerating regular diet.   Ambulating and voiding without difficulty.   Denies fevers, chills, chest pain, SOB, N/V, or worsening bilateral LE edema.  Lochia: Minimal Infant feeding:  Breast Circumcision:  Desires Contraception:  Undecided  O: Temp:  [97.6 F (36.4 C)-101.3 F (38.5 C)] 97.6 F (36.4 C) (11/01 0636) Pulse Rate:  [63-81] 74 (11/01 0636) Resp:  [17-20] 20 (11/01 0636) BP: (121-137)/(71-91) 137/86 (11/01 0636) SpO2:  [97 %-100 %] 100 % (11/01 0636) Gen: NAD, pleasant and cooperative Resp: No increased work of breathing Abdomen: soft, non-distended, non-tender throughout Uterus: firm, non-tender, below umbilicus Ext: trace bilateral LE edema, no bilateral calf tenderness  Results for orders placed or performed during the hospital encounter of 01/28/20  OB RESULT CONSOLE Group B Strep  Result Value Ref Range   GBS Negative   OB RESULTS CONSOLE GC/Chlamydia  Result Value Ref Range   Gonorrhea Negative    Chlamydia Negative   OB RESULTS CONSOLE RPR  Result Value Ref Range   RPR Nonreactive   OB RESULTS CONSOLE HIV antibody  Result Value Ref Range   HIV Non-reactive   OB RESULTS CONSOLE Rubella Antibody  Result Value Ref Range   Rubella Immune   OB RESULTS CONSOLE Hepatitis B surface antigen  Result Value Ref Range   Hepatitis B Surface Ag Negative   CBC  Result Value Ref Range   WBC 9.7 4.0 - 10.5 K/uL   RBC 4.23 3.87 - 5.11 MIL/uL   Hemoglobin 12.9 12.0 - 15.0 g/dL   HCT 08.6 36 - 46 %   MCV 92.4 80.0 - 100.0 fL   MCH 30.5 26.0 - 34.0 pg   MCHC 33.0 30.0 - 36.0 g/dL   RDW 57.8 46.9 - 62.9 %   Platelets 171 150 - 400 K/uL   nRBC 0.0 0.0 - 0.2 %  RPR  Result Value Ref Range   RPR Ser Ql NON REACTIVE NON REACTIVE  Comprehensive metabolic panel  Result Value Ref Range   Sodium 138 135 - 145 mmol/L   Potassium 4.1 3.5 - 5.1 mmol/L   Chloride 108 98 - 111 mmol/L   CO2 21 (L) 22 - 32  mmol/L   Glucose, Bld 87 70 - 99 mg/dL   BUN 7 6 - 20 mg/dL   Creatinine, Ser 5.28 0.44 - 1.00 mg/dL   Calcium 8.9 8.9 - 41.3 mg/dL   Total Protein 5.9 (L) 6.5 - 8.1 g/dL   Albumin 3.1 (L) 3.5 - 5.0 g/dL   AST 24 15 - 41 U/L   ALT 13 0 - 44 U/L   Alkaline Phosphatase 136 (H) 38 - 126 U/L   Total Bilirubin 0.8 0.3 - 1.2 mg/dL   GFR, Estimated >24 >40 mL/min   Anion gap 9 5 - 15  Protein / creatinine ratio, urine  Result Value Ref Range   Creatinine, Urine 157.58 mg/dL   Total Protein, Urine 23 mg/dL   Protein Creatinine Ratio 0.15 0.00 - 0.15 mg/mg[Cre]  Glucose, capillary  Result Value Ref Range   Glucose-Capillary 128 (H) 70 - 99 mg/dL  Glucose, capillary  Result Value Ref Range   Glucose-Capillary 75 70 - 99 mg/dL  Glucose, capillary  Result Value Ref Range   Glucose-Capillary 72 70 - 99 mg/dL  Glucose, capillary  Result Value Ref Range   Glucose-Capillary 81 70 - 99 mg/dL  Glucose, capillary  Result Value Ref Range  Glucose-Capillary 73 70 - 99 mg/dL  CBC  Result Value Ref Range   WBC 11.7 (H) 4.0 - 10.5 K/uL   RBC 4.10 3.87 - 5.11 MIL/uL   Hemoglobin 12.3 12.0 - 15.0 g/dL   HCT 67.3 36 - 46 %   MCV 93.2 80.0 - 100.0 fL   MCH 30.0 26.0 - 34.0 pg   MCHC 32.2 30.0 - 36.0 g/dL   RDW 41.9 37.9 - 02.4 %   Platelets 167 150 - 400 K/uL   nRBC 0.0 0.0 - 0.2 %  Glucose, capillary  Result Value Ref Range   Glucose-Capillary 61 (L) 70 - 99 mg/dL  Glucose, capillary  Result Value Ref Range   Glucose-Capillary 71 70 - 99 mg/dL  Glucose, capillary  Result Value Ref Range   Glucose-Capillary 71 70 - 99 mg/dL  Glucose, capillary  Result Value Ref Range   Glucose-Capillary 83 70 - 99 mg/dL  Glucose, capillary  Result Value Ref Range   Glucose-Capillary 58 (L) 70 - 99 mg/dL  Glucose, capillary  Result Value Ref Range   Glucose-Capillary 82 70 - 99 mg/dL  Glucose, capillary  Result Value Ref Range   Glucose-Capillary 72 70 - 99 mg/dL  Comprehensive metabolic panel   Result Value Ref Range   Sodium 139 135 - 145 mmol/L   Potassium 3.8 3.5 - 5.1 mmol/L   Chloride 107 98 - 111 mmol/L   CO2 20 (L) 22 - 32 mmol/L   Glucose, Bld 73 70 - 99 mg/dL   BUN <5 (L) 6 - 20 mg/dL   Creatinine, Ser 0.97 0.44 - 1.00 mg/dL   Calcium 8.6 (L) 8.9 - 10.3 mg/dL   Total Protein 5.4 (L) 6.5 - 8.1 g/dL   Albumin 2.9 (L) 3.5 - 5.0 g/dL   AST 23 15 - 41 U/L   ALT 12 0 - 44 U/L   Alkaline Phosphatase 154 (H) 38 - 126 U/L   Total Bilirubin 1.0 0.3 - 1.2 mg/dL   GFR, Estimated >35 >32 mL/min   Anion gap 12 5 - 15  CBC  Result Value Ref Range   WBC 12.7 (H) 4.0 - 10.5 K/uL   RBC 4.16 3.87 - 5.11 MIL/uL   Hemoglobin 12.7 12.0 - 15.0 g/dL   HCT 99.2 36 - 46 %   MCV 93.3 80.0 - 100.0 fL   MCH 30.5 26.0 - 34.0 pg   MCHC 32.7 30.0 - 36.0 g/dL   RDW 42.6 83.4 - 19.6 %   Platelets 134 (L) 150 - 400 K/uL   nRBC 0.0 0.0 - 0.2 %  Protein / creatinine ratio, urine  Result Value Ref Range   Creatinine, Urine 69.78 mg/dL   Total Protein, Urine 69 mg/dL   Protein Creatinine Ratio 0.99 (H) 0.00 - 0.15 mg/mg[Cre]  Glucose, capillary  Result Value Ref Range   Glucose-Capillary 67 (L) 70 - 99 mg/dL  Glucose, capillary  Result Value Ref Range   Glucose-Capillary 76 70 - 99 mg/dL  Glucose, capillary  Result Value Ref Range   Glucose-Capillary 70 70 - 99 mg/dL  Glucose, capillary  Result Value Ref Range   Glucose-Capillary 94 70 - 99 mg/dL  CBC  Result Value Ref Range   WBC 16.1 (H) 4.0 - 10.5 K/uL   RBC 3.81 (L) 3.87 - 5.11 MIL/uL   Hemoglobin 11.5 (L) 12.0 - 15.0 g/dL   HCT 22.2 (L) 36 - 46 %   MCV 92.4 80.0 - 100.0 fL   MCH 30.2  26.0 - 34.0 pg   MCHC 32.7 30.0 - 36.0 g/dL   RDW 16.1 09.6 - 04.5 %   Platelets 129 (L) 150 - 400 K/uL   nRBC 0.0 0.0 - 0.2 %  Comprehensive metabolic panel  Result Value Ref Range   Sodium 138 135 - 145 mmol/L   Potassium 3.8 3.5 - 5.1 mmol/L   Chloride 108 98 - 111 mmol/L   CO2 22 22 - 32 mmol/L   Glucose, Bld 68 (L) 70 - 99 mg/dL    BUN 12 6 - 20 mg/dL   Creatinine, Ser 4.09 (H) 0.44 - 1.00 mg/dL   Calcium 8.2 (L) 8.9 - 10.3 mg/dL   Total Protein 4.6 (L) 6.5 - 8.1 g/dL   Albumin 2.2 (L) 3.5 - 5.0 g/dL   AST 32 15 - 41 U/L   ALT 14 0 - 44 U/L   Alkaline Phosphatase 111 38 - 126 U/L   Total Bilirubin 0.7 0.3 - 1.2 mg/dL   GFR, Estimated >81 >19 mL/min   Anion gap 8 5 - 15  Glucose, capillary  Result Value Ref Range   Glucose-Capillary 65 (L) 70 - 99 mg/dL  OB RESULTS CONSOLE ABO/Rh  Result Value Ref Range   RH Type  Positive    ABO Grouping A   OB RESULTS CONSOLE Antibody Screen  Result Value Ref Range   Antibody Screen Negative   Type and screen [facility]  Result Value Ref Range   ABO/RH(D) A POS    Antibody Screen NEG    Sample Expiration      01/31/2020,2359 Performed at [facility], [address], [place], [place] [address]     A/P: Patient is a 24 y.o. G2P1011 PPD#1 s/p SVD.  Pregnancy complicated by preeclampsia with SF.  S/p SVD - Pain well controlled  - GU: UOP is adequate - GI: Tolerating regular diet - Activity: encouraged sitting up to chair and ambulation as tolerated - DVT Prophylaxis: SCDs - Labs: as above  Preeclampsia with SF - Bps normal to mild range  - Occasional headaches and scotoma, none this AM - HELLP labs:  Creatinine 1.03 from 0.80  Plt 129  P/C ratio I&O 0.99 - Magnesium sulfate x 24 hours ordered - Strict I&O q2h, if unable to void will place foley catheter  Thrombocytopenia - Gestational vs secondary to preeclampsia - Plt 167-->134-->129 - Repeat labs at 1500  Chorioamnionitis - S/p treatment with Unasyn - Tmax/last: 101.3 on 10/31 at 01731 - Has remained afebrile without uterine tenderness  Disposition:  Likely PPD#2.  Steva Ready, DO [phone] (office)

## 2020-02-01 NOTE — Progress Notes (Signed)
Report given to [facility] RN;  Patient, infant, and FOB settled into room.

## 2020-02-02 LAB — CBC
HCT: 33.1 % — ABNORMAL LOW (ref 36.0–46.0)
Hemoglobin: 10.9 g/dL — ABNORMAL LOW (ref 12.0–15.0)
MCH: 30.9 pg (ref 26.0–34.0)
MCHC: 32.9 g/dL (ref 30.0–36.0)
MCV: 93.8 fL (ref 80.0–100.0)
Platelets: 157 10*3/uL (ref 150–400)
RBC: 3.53 MIL/uL — ABNORMAL LOW (ref 3.87–5.11)
RDW: 14.6 % (ref 11.5–15.5)
WBC: 7.4 10*3/uL (ref 4.0–10.5)
nRBC: 0 % (ref 0.0–0.2)

## 2020-02-02 LAB — COMPREHENSIVE METABOLIC PANEL
ALT: 16 U/L (ref 0–44)
AST: 33 U/L (ref 15–41)
Albumin: 2.4 g/dL — ABNORMAL LOW (ref 3.5–5.0)
Alkaline Phosphatase: 104 U/L (ref 38–126)
Anion gap: 8 (ref 5–15)
BUN: 8 mg/dL (ref 6–20)
CO2: 25 mmol/L (ref 22–32)
Calcium: 7.2 mg/dL — ABNORMAL LOW (ref 8.9–10.3)
Chloride: 105 mmol/L (ref 98–111)
Creatinine, Ser: 0.72 mg/dL (ref 0.44–1.00)
GFR, Estimated: >60 mL/min (ref >60)
Glucose, Bld: 87 mg/dL (ref 70–99)
Potassium: 4 mmol/L (ref 3.5–5.1)
Sodium: 138 mmol/L (ref 135–145)
Total Bilirubin: 0.6 mg/dL (ref 0.3–1.2)
Total Protein: 4.8 g/dL — ABNORMAL LOW (ref 6.5–8.1)

## 2020-02-02 LAB — LACTATE DEHYDROGENASE: LDH: 247 U/L — ABNORMAL HIGH (ref 98–192)

## 2020-02-02 LAB — SURGICAL PATHOLOGY

## 2020-02-02 MED ORDER — SENNA 8.6 MG PO TABS: 1.0000 | ORAL_TABLET | Freq: Every day | ORAL | 0 refills | Status: DC | PRN

## 2020-02-02 MED ORDER — IBUPROFEN 800 MG PO TABS: 800.0000 mg | ORAL_TABLET | Freq: Three times a day (TID) | ORAL | 0 refills | Status: DC | PRN

## 2020-02-02 MED ORDER — DOCUSATE SODIUM 100 MG PO CAPS: 100.0000 mg | ORAL_CAPSULE | Freq: Two times a day (BID) | ORAL | 3 refills | Status: AC

## 2020-02-02 MED ORDER — NORETHINDRONE 0.35 MG PO TABS: 1.0000 | ORAL_TABLET | Freq: Every day | ORAL | 11 refills | Status: DC

## 2020-02-02 NOTE — Lactation Note (Signed)
This note was copied from a baby's chart. Lactation Consultation Note  Patient Name: Kimberly Hunter [id number] Date: 02/02/2020 Reason for consult: Follow-up assessment;Infant weight loss;1st time breastfeeding  LC to room for f/u visit. Mom and baby to d/c today. Baby at 8% wt loss. Mom more comfortable today with position/latch. She is using a nipple shield and supplementing prn. Encouraged bf with cues and watching for 8+bf and 3-4 voids/stools over the next 24 hours. Mom to f/u tomorrow with Ped for infant wt check. Mom aware of community resources for [facility] support prn.   Interventions Interventions: Breast feeding basics reviewed;Skin to skin;Position options;DEBP  Lactation Tools Discussed/Used     Consult Status Consult Status: Complete Date: 02/02/20 Follow-up type: In-patient    [name] 02/02/2020, 9:29 AM

## 2020-02-02 NOTE — Social Work (Signed)
CSW received consult for hx of Anxiety. CSW met with MOB to offer support and complete assessment.     CSW entered room and observed MOB holding baby and FOB bedside. CSW introduced self and role. CSW asked MOB if she would like to speak alone for privacy, MOB declined. CSW informed MOB of reason for consult. MOB expressed understanding. MOB confirmed a diagnosis of anxiety. MOB stated she was first diagnosed about 3 years ago in 2018. MOB expressed she believes it was mostly work related and once she found a new job it went away. MOB stated she was on medication for 2-3 months during that time and did not see much of a difference in symptoms. MOB has never attended therapy. MOB stated she did pretty well during pregnancy and she is currently feeling great. MOB identified her parents, friends and siblings as supports. MOB denies any current SI or HI.   CSW provided education regarding the baby blues period vs. perinatal mood disorders, discussed treatment and offered resources for mental health follow up if concerns arise.  MOB declined additional resources. 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 CSW recommends self-evaluation during the postpartum time period using the New Mom Checklist from Postpartum Progress and encouraged MOB to contact a medical professional if symptoms are noted at any time.    CSW provided review of Sudden Infant Death Syndrome (SIDS) precautions.  MOB stated baby will sleep in a bassinet and crib once discharged home. MOB has all of the essential needs for baby, including a new carseat. Baby will receive follow-up care at [facility]. MOB denies any transportation barriers.   CSW identifies no further need for intervention and no barriers to discharge at this time.  Ugonna Keirsey, LCSWA Clinical Social Work [facility] [phone] 

## 2020-02-02 NOTE — Lactation Note (Signed)
This note was copied from a baby's chart. Lactation Consultation Note  Patient Name: Kimberly Hunter [id number] Date: 02/02/2020 Reason for consult: Follow-up assessment;Infant weight loss;Other (Comment) (RN request)  Returned at [employer] request to answer mom's questions about medications while bf. Mother and baby were attempting to bf when LC arrived. LC assisted with placing dyad in biological position and observed infant rooting. Nipple shield was applied and baby self-latched and began sucking wnl. Mom comfortable and pleased with infant's progress. LC reviewed feeding cues and suggested parents continue to offer about 31mL of formula p bf because of infant's wt loss. Parents to f/u with Ped tomorrow and LC prn.   Feeding Feeding Type: Breast Fed  LATCH Score Latch: Grasps breast easily, tongue down, lips flanged, rhythmical sucking.  Audible Swallowing: Spontaneous and intermittent  Type of Nipple: Everted at rest and after stimulation  Comfort (Breast/Nipple): Soft / non-tender  Hold (Positioning): Assistance needed to correctly position infant at breast and maintain latch.  LATCH Score: 9  Interventions Interventions: Breast feeding basics reviewed;Support pillows;Position options;Assisted with latch;Skin to skin;Expressed milk;Hand express;Adjust position  Lactation Tools Discussed/Used Tools: Nipple Dorris Carnes   Consult Status Consult Status: Complete Date: 02/02/20 Follow-up type: In-patient    Kimberly Hunter 02/02/2020, 11:36 AM

## 2020-02-02 NOTE — Progress Notes (Signed)
Postpartum Note Day #2  S:  Patient doing well.  Pain controlled.  Has been on Magnesium.  Tolerating regular diet.   Ambulating and voiding without difficulty.   Denies fevers, chills, chest pain, SOB, N/V, or worsening bilateral LE edema.  Lochia: Minimal Infant feeding:  Breast Circumcision:  Desires, scheduled this AM Contraception:  Progestin-only pills  O: Temp:  [97.7 F (36.5 C)-98.3 F (36.8 C)] 98.3 F (36.8 C) (11/02 0313) Pulse Rate:  [61-105] 79 (11/02 0313) Resp:  [14-18] 18 (11/02 0625) BP: (116-144)/(56-100) 125/77 (11/02 0313) SpO2:  [97 %-100 %] 100 % (11/02 0313) Gen: NAD, pleasant and cooperative Cardio:  RRR Resp: No increased work of breathing Abdomen: soft, non-distended, non-tender throughout Uterus: firm, non-tender, below umbilicus Ext: trace bilateral LE edema, no bilateral calf tenderness  Results for orders placed or performed during the hospital encounter of 01/28/20  OB RESULT CONSOLE Group B Strep  Result Value Ref Range   GBS Negative   OB RESULTS CONSOLE GC/Chlamydia  Result Value Ref Range   Gonorrhea Negative    Chlamydia Negative   OB RESULTS CONSOLE RPR  Result Value Ref Range   RPR Nonreactive   OB RESULTS CONSOLE HIV antibody  Result Value Ref Range   HIV Non-reactive   OB RESULTS CONSOLE Rubella Antibody  Result Value Ref Range   Rubella Immune   OB RESULTS CONSOLE Hepatitis B surface antigen  Result Value Ref Range   Hepatitis B Surface Ag Negative   CBC  Result Value Ref Range   WBC 9.7 4.0 - 10.5 K/uL   RBC 4.23 3.87 - 5.11 MIL/uL   Hemoglobin 12.9 12.0 - 15.0 g/dL   HCT 16.1 36 - 46 %   MCV 92.4 80.0 - 100.0 fL   MCH 30.5 26.0 - 34.0 pg   MCHC 33.0 30.0 - 36.0 g/dL   RDW 09.6 04.5 - 40.9 %   Platelets 171 150 - 400 K/uL   nRBC 0.0 0.0 - 0.2 %  RPR  Result Value Ref Range   RPR Ser Ql NON REACTIVE NON REACTIVE  Comprehensive metabolic panel  Result Value Ref Range   Sodium 138 135 - 145 mmol/L   Potassium 4.1  3.5 - 5.1 mmol/L   Chloride 108 98 - 111 mmol/L   CO2 21 (L) 22 - 32 mmol/L   Glucose, Bld 87 70 - 99 mg/dL   BUN 7 6 - 20 mg/dL   Creatinine, Ser 8.11 0.44 - 1.00 mg/dL   Calcium 8.9 8.9 - 91.4 mg/dL   Total Protein 5.9 (L) 6.5 - 8.1 g/dL   Albumin 3.1 (L) 3.5 - 5.0 g/dL   AST 24 15 - 41 U/L   ALT 13 0 - 44 U/L   Alkaline Phosphatase 136 (H) 38 - 126 U/L   Total Bilirubin 0.8 0.3 - 1.2 mg/dL   GFR, Estimated >78 >29 mL/min   Anion gap 9 5 - 15  Protein / creatinine ratio, urine  Result Value Ref Range   Creatinine, Urine 157.58 mg/dL   Total Protein, Urine 23 mg/dL   Protein Creatinine Ratio 0.15 0.00 - 0.15 mg/mg[Cre]  Glucose, capillary  Result Value Ref Range   Glucose-Capillary 128 (H) 70 - 99 mg/dL  Glucose, capillary  Result Value Ref Range   Glucose-Capillary 75 70 - 99 mg/dL  Glucose, capillary  Result Value Ref Range   Glucose-Capillary 72 70 - 99 mg/dL  Glucose, capillary  Result Value Ref Range   Glucose-Capillary 81 70 -  99 mg/dL  Glucose, capillary  Result Value Ref Range   Glucose-Capillary 73 70 - 99 mg/dL  CBC  Result Value Ref Range   WBC 11.7 (H) 4.0 - 10.5 K/uL   RBC 4.10 3.87 - 5.11 MIL/uL   Hemoglobin 12.3 12.0 - 15.0 g/dL   HCT 40.9 36 - 46 %   MCV 93.2 80.0 - 100.0 fL   MCH 30.0 26.0 - 34.0 pg   MCHC 32.2 30.0 - 36.0 g/dL   RDW 81.1 91.4 - 78.2 %   Platelets 167 150 - 400 K/uL   nRBC 0.0 0.0 - 0.2 %  Glucose, capillary  Result Value Ref Range   Glucose-Capillary 61 (L) 70 - 99 mg/dL  Glucose, capillary  Result Value Ref Range   Glucose-Capillary 71 70 - 99 mg/dL  Glucose, capillary  Result Value Ref Range   Glucose-Capillary 71 70 - 99 mg/dL  Glucose, capillary  Result Value Ref Range   Glucose-Capillary 83 70 - 99 mg/dL  Glucose, capillary  Result Value Ref Range   Glucose-Capillary 58 (L) 70 - 99 mg/dL  Glucose, capillary  Result Value Ref Range   Glucose-Capillary 82 70 - 99 mg/dL  Glucose, capillary  Result Value Ref Range    Glucose-Capillary 72 70 - 99 mg/dL  Comprehensive metabolic panel  Result Value Ref Range   Sodium 139 135 - 145 mmol/L   Potassium 3.8 3.5 - 5.1 mmol/L   Chloride 107 98 - 111 mmol/L   CO2 20 (L) 22 - 32 mmol/L   Glucose, Bld 73 70 - 99 mg/dL   BUN <5 (L) 6 - 20 mg/dL   Creatinine, Ser 9.56 0.44 - 1.00 mg/dL   Calcium 8.6 (L) 8.9 - 10.3 mg/dL   Total Protein 5.4 (L) 6.5 - 8.1 g/dL   Albumin 2.9 (L) 3.5 - 5.0 g/dL   AST 23 15 - 41 U/L   ALT 12 0 - 44 U/L   Alkaline Phosphatase 154 (H) 38 - 126 U/L   Total Bilirubin 1.0 0.3 - 1.2 mg/dL   GFR, Estimated >21 >30 mL/min   Anion gap 12 5 - 15  CBC  Result Value Ref Range   WBC 12.7 (H) 4.0 - 10.5 K/uL   RBC 4.16 3.87 - 5.11 MIL/uL   Hemoglobin 12.7 12.0 - 15.0 g/dL   HCT 86.5 36 - 46 %   MCV 93.3 80.0 - 100.0 fL   MCH 30.5 26.0 - 34.0 pg   MCHC 32.7 30.0 - 36.0 g/dL   RDW 78.4 69.6 - 29.5 %   Platelets 134 (L) 150 - 400 K/uL   nRBC 0.0 0.0 - 0.2 %  Protein / creatinine ratio, urine  Result Value Ref Range   Creatinine, Urine 69.78 mg/dL   Total Protein, Urine 69 mg/dL   Protein Creatinine Ratio 0.99 (H) 0.00 - 0.15 mg/mg[Cre]  Glucose, capillary  Result Value Ref Range   Glucose-Capillary 67 (L) 70 - 99 mg/dL  Glucose, capillary  Result Value Ref Range   Glucose-Capillary 76 70 - 99 mg/dL  Glucose, capillary  Result Value Ref Range   Glucose-Capillary 70 70 - 99 mg/dL  Glucose, capillary  Result Value Ref Range   Glucose-Capillary 94 70 - 99 mg/dL  CBC  Result Value Ref Range   WBC 16.1 (H) 4.0 - 10.5 K/uL   RBC 3.81 (L) 3.87 - 5.11 MIL/uL   Hemoglobin 11.5 (L) 12.0 - 15.0 g/dL   HCT 28.4 (L) 36 - 46 %  MCV 92.4 80.0 - 100.0 fL   MCH 30.2 26.0 - 34.0 pg   MCHC 32.7 30.0 - 36.0 g/dL   RDW 21.314.3 08.611.5 - 57.815.5 %   Platelets 129 (L) 150 - 400 K/uL   nRBC 0.0 0.0 - 0.2 %  Comprehensive metabolic panel  Result Value Ref Range   Sodium 138 135 - 145 mmol/L   Potassium 3.8 3.5 - 5.1 mmol/L   Chloride 108 98 - 111  mmol/L   CO2 22 22 - 32 mmol/L   Glucose, Bld 68 (L) 70 - 99 mg/dL   BUN 12 6 - 20 mg/dL   Creatinine, Ser 4.691.03 (H) 0.44 - 1.00 mg/dL   Calcium 8.2 (L) 8.9 - 10.3 mg/dL   Total Protein 4.6 (L) 6.5 - 8.1 g/dL   Albumin 2.2 (L) 3.5 - 5.0 g/dL   AST 32 15 - 41 U/L   ALT 14 0 - 44 U/L   Alkaline Phosphatase 111 38 - 126 U/L   Total Bilirubin 0.7 0.3 - 1.2 mg/dL   GFR, Estimated >62>60 >95>60 mL/min   Anion gap 8 5 - 15  CBC  Result Value Ref Range   WBC 7.1 4.0 - 10.5 K/uL   RBC 3.56 (L) 3.87 - 5.11 MIL/uL   Hemoglobin 10.8 (L) 12.0 - 15.0 g/dL   HCT 28.433.1 (L) 36 - 46 %   MCV 93.0 80.0 - 100.0 fL   MCH 30.3 26.0 - 34.0 pg   MCHC 32.6 30.0 - 36.0 g/dL   RDW 13.214.4 44.011.5 - 10.215.5 %   Platelets 129 (L) 150 - 400 K/uL   nRBC 0.0 0.0 - 0.2 %  Comprehensive metabolic panel  Result Value Ref Range   Sodium 139 135 - 145 mmol/L   Potassium 4.1 3.5 - 5.1 mmol/L   Chloride 108 98 - 111 mmol/L   CO2 23 22 - 32 mmol/L   Glucose, Bld 90 70 - 99 mg/dL   BUN 9 6 - 20 mg/dL   Creatinine, Ser 7.250.89 0.44 - 1.00 mg/dL   Calcium 8.1 (L) 8.9 - 10.3 mg/dL   Total Protein 4.6 (L) 6.5 - 8.1 g/dL   Albumin 2.2 (L) 3.5 - 5.0 g/dL   AST 29 15 - 41 U/L   ALT 14 0 - 44 U/L   Alkaline Phosphatase 108 38 - 126 U/L   Total Bilirubin 0.8 0.3 - 1.2 mg/dL   GFR, Estimated >36>60 >64>60 mL/min   Anion gap 8 5 - 15  Lactate dehydrogenase  Result Value Ref Range   LDH 209 (H) 98 - 192 U/L  Glucose, capillary  Result Value Ref Range   Glucose-Capillary 65 (L) 70 - 99 mg/dL  CBC  Result Value Ref Range   WBC 7.4 4.0 - 10.5 K/uL   RBC 3.53 (L) 3.87 - 5.11 MIL/uL   Hemoglobin 10.9 (L) 12.0 - 15.0 g/dL   HCT 40.333.1 (L) 36 - 46 %   MCV 93.8 80.0 - 100.0 fL   MCH 30.9 26.0 - 34.0 pg   MCHC 32.9 30.0 - 36.0 g/dL   RDW 47.414.6 25.911.5 - 56.315.5 %   Platelets 157 150 - 400 K/uL   nRBC 0.0 0.0 - 0.2 %  Comprehensive metabolic panel  Result Value Ref Range   Sodium 138 135 - 145 mmol/L   Potassium 4.0 3.5 - 5.1 mmol/L   Chloride 105  98 - 111 mmol/L   CO2 25 22 - 32 mmol/L   Glucose, Bld 87  70 - 99 mg/dL   BUN 8 6 - 20 mg/dL   Creatinine, Ser 6.64 0.44 - 1.00 mg/dL   Calcium 7.2 (L) 8.9 - 10.3 mg/dL   Total Protein 4.8 (L) 6.5 - 8.1 g/dL   Albumin 2.4 (L) 3.5 - 5.0 g/dL   AST 33 15 - 41 U/L   ALT 16 0 - 44 U/L   Alkaline Phosphatase 104 38 - 126 U/L   Total Bilirubin 0.6 0.3 - 1.2 mg/dL   GFR, Estimated >40 >34 mL/min   Anion gap 8 5 - 15  Lactate dehydrogenase  Result Value Ref Range   LDH 247 (H) 98 - 192 U/L  OB RESULTS CONSOLE ABO/Rh  Result Value Ref Range   RH Type  Positive    ABO Grouping A   OB RESULTS CONSOLE Antibody Screen  Result Value Ref Range   Antibody Screen Negative   Type and screen [facility]  Result Value Ref Range   ABO/RH(D) A POS    Antibody Screen NEG    Sample Expiration      01/31/2020,2359 Performed at [facility], [address], [place], [place] [address]     A/P: Patient is a 24 y.o. G2P1011 PPD#2 s/p SVD.  Pregnancy complicated by preeclampsia with SF.  S/p SVD - Pain well controlled  - GU: UOP is adequate - GI: Tolerating regular diet - Activity: encouraged sitting up to chair and ambulation as tolerated - DVT Prophylaxis: SCDs - Labs: as above  Preeclampsia with SF - Bps normal to mild range  - Occasional headaches and scotoma, none this AM - HELLP labs:  Creatinine trend: 0.8-->1.03-->0.89   Plt 129-->>157  P/C ratio I&O 0.99 - Discontinue Magnesium sulfate this AM - 1 week BP check in our office requested  Thrombocytopenia (resolved) - Gestational vs secondary to preeclampsia - Plt 167-->134-->129-->157  Chorioamnionitis - S/p treatment with Unasyn - Tmax/last: 101.3 on 10/31 at 01731 - Has remained afebrile without uterine tenderness  Disposition: Today  Steva Ready, DO [phone] (office)

## 2020-02-02 NOTE — Progress Notes (Signed)
Pt discharged after discharge instructions given. All questions answered. IV was discontinued. Pt in stable condition and sent with all belongings.

## 2020-02-02 NOTE — Discharge Summary (Signed)
Postpartum Discharge Summary  02/02/20    Patient Name: Kimberly Hunter DOB: 08/05/1995 MRN: [id number]  Date of admission: 01/28/2020 Delivery date:01/31/2020  Delivering provider: Thurnell Lose  Date of discharge: 02/02/2020  Admitting diagnosis: Gestational hypertension [O13.9] Intrauterine pregnancy: [redacted]w[redacted]d    Secondary diagnosis:  Active Problems:   Gestational hypertension  Additional problems: Preeclampsia with SF    Discharge diagnosis: Term Pregnancy Delivered and Preeclampsia (severe)                                              Post partum procedures:none Augmentation: AROM, Pitocin, Cytotec and IP Foley Complications: Intrauterine Inflammation or infection (Chorioamniotis) and ROM>24 hours  Hospital course: Induction of Labor With Vaginal Delivery   24y.o. yo G2P1011 at 385w2das admitted to the hospital 01/28/2020 for induction of labor.  Indication for induction: Gestational hypertension.  Patient had an uncomplicated labor course as follows: Membrane Rupture Time/Date: 11:13 PM ,01/29/2020   Delivery Method:Vaginal, Spontaneous  Episiotomy: None  Lacerations:  Labial;Periurethral   During her hospital stay she developed pre-eclampsia with severe features (elevated Creatinine) but blood pressures remained normal to mild range.  Creatinine returned to normal prior to discharge home. Details of delivery can be found in separate delivery note.  Patient had a routine postpartum course. Patient is discharged home 02/02/20.  Newborn Data: Birth date:01/31/2020  Birth time:3:08 AM  Gender:Female  Living status:Living  Apgars:8 ,9  Weight:3589 g   Magnesium Sulfate received: Yes: Seizure prophylaxis BMZ received: No Rhophylac:No MMR:No T-DaP:Given prenatally Flu:  Unknown Transfusion:No  Physical exam  Vitals:   02/02/20 0313 02/02/20 0415 02/02/20 0530 02/02/20 0625  BP: 125/77     Pulse: 79     Resp: '18 17 18 18  ' Temp: 98.3 F (36.8 C)      TempSrc: Oral     SpO2: 100%     Weight:      Height:       General: alert, cooperative and no distress Lochia: appropriate Uterine Fundus: firm Incision: N/A DVT Evaluation: No evidence of DVT seen on physical exam. No cords or calf tenderness. Labs: Lab Results  Component Value Date   WBC 7.4 02/02/2020   HGB 10.9 (L) 02/02/2020   HCT 33.1 (L) 02/02/2020   MCV 93.8 02/02/2020   PLT 157 02/02/2020   CMP Latest Ref Rng & Units 02/02/2020  Glucose 70 - 99 mg/dL 87  BUN 6 - 20 mg/dL 8  Creatinine 0.44 - 1.00 mg/dL 0.72  Sodium 135 - 145 mmol/L 138  Potassium 3.5 - 5.1 mmol/L 4.0  Chloride 98 - 111 mmol/L 105  CO2 22 - 32 mmol/L 25  Calcium 8.9 - 10.3 mg/dL 7.2(L)  Total Protein 6.5 - 8.1 g/dL 4.8(L)  Total Bilirubin 0.3 - 1.2 mg/dL 0.6  Alkaline Phos 38 - 126 U/L 104  AST 15 - 41 U/L 33  ALT 0 - 44 U/L 16   Edinburgh Score: Edinburgh Postnatal Depression Scale Screening Tool 01/31/2020  I have been able to laugh and see the funny side of things. (No Data)      After visit meds:  Allergies as of 02/02/2020   No Known Allergies     Medication List    TAKE these medications   acetaminophen 500 MG tablet Commonly known as: TYLENOL Take 500 mg by mouth every 8 (eight) hours as  needed for mild pain.   albuterol 108 (90 Base) MCG/ACT inhaler Commonly known as: VENTOLIN HFA Inhale 1-2 puffs into the lungs every 4 (four) hours as needed for wheezing or shortness of breath (or cough).   docusate sodium 100 MG capsule Commonly known as: Colace Take 1 capsule (100 mg total) by mouth 2 (two) times daily.   ibuprofen 800 MG tablet Commonly known as: ADVIL Take 1 tablet (800 mg total) by mouth every 8 (eight) hours as needed.   norethindrone 0.35 MG tablet Commonly known as: MICRONOR Take 1 tablet (0.35 mg total) by mouth daily.   prenatal multivitamin Tabs tablet Take 2 tablets by mouth daily at 12 noon. Gummy vit   senna 8.6 MG Tabs tablet Commonly known as:  SENOKOT Take 1 tablet (8.6 mg total) by mouth daily as needed for mild constipation.       Discharge home in stable condition Infant Feeding: Breast Infant Disposition:home with mother Discharge instruction: per After Visit Summary and Postpartum booklet. Activity: Advance as tolerated. Pelvic rest for 6 weeks.  Diet: routine diet Anticipated Birth Control: POPs Postpartum Appointment:1 week and 6 week PPV Additional Postpartum F/U: BP check 1 week Future Appointments:No future appointments. Follow up Visit: 1 week BP check and 6 week PPV    02/02/2020 Drema Dallas, DO

## 2020-02-05 ENCOUNTER — Inpatient Hospital Stay (HOSPITAL_COMMUNITY): Admission: AD | Admit: 2020-02-05 | Payer: 59 | Source: Home / Self Care | Admitting: Obstetrics and Gynecology

## 2020-02-05 ENCOUNTER — Inpatient Hospital Stay (HOSPITAL_COMMUNITY): Payer: 59

## 2021-06-28 ENCOUNTER — Encounter: Payer: Self-pay | Admitting: Emergency Medicine

## 2021-06-28 ENCOUNTER — Other Ambulatory Visit: Payer: Self-pay

## 2021-06-28 ENCOUNTER — Ambulatory Visit
Admission: EM | Admit: 2021-06-28 | Discharge: 2021-06-28 | Disposition: A | Discharge status: Home / Self Care | Payer: 59 | Attending: Internal Medicine | Admitting: Internal Medicine

## 2021-06-28 DIAGNOSIS — R3915 Urgency of urination: Secondary | ICD-10-CM

## 2021-06-28 DIAGNOSIS — R300 Dysuria: Secondary | ICD-10-CM

## 2021-06-28 DIAGNOSIS — N898 Other specified noninflammatory disorders of vagina: Secondary | ICD-10-CM | POA: Diagnosis not present

## 2021-06-28 LAB — POCT URINALYSIS DIP (MANUAL ENTRY)
Bilirubin, UA: NEGATIVE
Glucose, UA: NEGATIVE mg/dL
Ketones, POC UA: NEGATIVE mg/dL
Leukocytes, UA: NEGATIVE
Nitrite, UA: NEGATIVE
Protein Ur, POC: NEGATIVE mg/dL
Spec Grav, UA: 1.02 (ref 1.010–1.025)
Urobilinogen, UA: 0.2 E.U./dL
pH, UA: 7 (ref 5.0–8.0)

## 2021-06-28 MED ORDER — METRONIDAZOLE 500 MG PO TABS: 500.0000 mg | ORAL_TABLET | Freq: Two times a day (BID) | ORAL | 0 refills | Status: DC

## 2021-06-28 NOTE — ED Triage Notes (Signed)
Patient c/o low back pain, pelvic discomfort, urinary urgency and frequency x 2 days.  Patient noticed hematuria today.  Patient is also having vaginal fishy discharge.  No concern for STI. ?

## 2021-06-28 NOTE — Discharge Instructions (Signed)
You are being treated with metronidazole antibiotic due to suspicion of bacterial vaginosis.  Please do not drink any alcohol with this medication.  Urine culture and vaginal swab are pending.  We will call if they are positive and treat as appropriate. ?

## 2021-06-28 NOTE — ED Provider Notes (Signed)
?[facility] ? ? ? ?CSN: [id number] ?Arrival date & time: 06/28/21  1734 ? ? ?  ? ?History   ?Chief Complaint ?Chief Complaint  ?Patient presents with  ? Urinary Urgency  ? ? ?HPI ?Kimberly Hunter is a 26 y.o. female.  ? ?Patient presents with low back pain, lower abdominal pain, urinary urgency, urinary frequency, dysuria, vaginal discharge, fishy vaginal odor that has been present for approximately 2 days.  She also reports hematuria that started today.  Denies any irregular vaginal bleeding.  Last menstrual cycle was 1 week ago.  Denies any known exposure to STD or any concern for STD.  Had video visit yesterday but was advised to follow-up in urgent care due to symptoms. ? ? ? ?Past Medical History:  ?Diagnosis Date  ? Anxiety   ? Asthma   ? last used inhaler in february  ? History of PCOS   ? IBS (irritable bowel syndrome)   ? ? ?Patient Active Problem List  ? Diagnosis Date Noted  ? Gestational hypertension 01/28/2020  ? Gestational diabetes mellitus (GDM), antepartum 12/18/2019  ? ? ?Past Surgical History:  ?Procedure Laterality Date  ? WISDOM TOOTH EXTRACTION    ? ? ?OB History   ? ? Gravida  ?2  ? Para  ?1  ? Term  ?1  ? Preterm  ?   ? AB  ?1  ? Living  ?1  ?  ? ? SAB  ?1  ? IAB  ?   ? Ectopic  ?   ? Multiple  ?0  ? Live Births  ?1  ?   ?  ?  ? ? ? ?Home Medications   ? ?Prior to Admission medications   ?Medication Sig Start Date End Date Taking? Authorizing Provider  ?acetaminophen (TYLENOL) 500 MG tablet Take 500 mg by mouth every 8 (eight) hours as needed for mild pain.   Yes [provider]  ?albuterol (PROVENTIL HFA;VENTOLIN HFA) 108 (90 Base) MCG/ACT inhaler Inhale 1-2 puffs into the lungs every 4 (four) hours as needed for wheezing or shortness of breath (or cough). 04/27/18  Yes Street, [place], PA-C  ?metroNIDAZOLE (FLAGYL) 500 MG tablet Take 1 tablet (500 mg total) by mouth 2 (two) times daily. 06/28/21  Yes Gustavus Bryant, FNP  ?paragard intrauterine copper IUD IUD See  admin instructions.   Yes [provider]  ?docusate sodium (COLACE) 100 MG capsule Take 1 capsule (100 mg total) by mouth 2 (two) times daily. 02/02/20   Steva Ready, DO  ?ibuprofen (ADVIL) 800 MG tablet Take 1 tablet (800 mg total) by mouth every 8 (eight) hours as needed. 02/02/20   Steva Ready, DO  ?norethindrone (MICRONOR) 0.35 MG tablet Take 1 tablet (0.35 mg total) by mouth daily. 02/02/20   Steva Ready, DO  ?Prenatal Vit-Fe Fumarate-FA (PRENATAL MULTIVITAMIN) TABS tablet Take 2 tablets by mouth daily at 12 noon. Gummy vit    [provider]  ?senna (SENOKOT) 8.6 MG TABS tablet Take 1 tablet (8.6 mg total) by mouth daily as needed for mild constipation. 02/02/20   Steva Ready, DO  ? ? ?Family History ?Family History  ?Problem Relation Age of Onset  ? Diabetes Mother   ? Hypertension Mother   ? Hypercholesterolemia Mother   ? Hypertension Father   ? Diabetes Sister   ? ? ?Social History ?Social History  ? ?Tobacco Use  ? Smoking status: Never  ? Smokeless tobacco: Never  ?Vaping Use  ? Vaping Use: Never used  ?  Substance Use Topics  ? Alcohol use: Not Currently  ?  Comment: occasionally  ? Drug use: Never  ? ? ? ?Allergies   ?Patient has no known allergies. ? ? ?Review of Systems ?Review of Systems ?Per HPI ? ?Physical Exam ?Triage Vital Signs ?ED Triage Vitals  ?Enc Vitals Group  ?   BP 06/28/21 1815 140/83  ?   Pulse Rate 06/28/21 1815 65  ?   Resp 06/28/21 1815 18  ?   Temp 06/28/21 1815 98.1 ?F (36.7 ?C)  ?   Temp Source 06/28/21 1815 Oral  ?   SpO2 06/28/21 1815 99 %  ?   Weight 06/28/21 1816 208 lb (94.3 kg)  ?   Height 06/28/21 1816 5\' 7"  (1.702 m)  ?   Head Circumference --   ?   Peak Flow --   ?   Pain Score 06/28/21 1816 5  ?   Pain Loc --   ?   Pain Edu? --   ?   Excl. in GC? --   ? ?No data found. ? ?Updated Vital Signs ?BP 140/83 (BP Location: Left Arm)   Pulse 65   Temp 98.1 ?F (36.7 ?C) (Oral)   Resp 18   Ht 5\' 7"  (1.702 m)   Wt 208 lb (94.3 kg)   SpO2 99%    Breastfeeding No   BMI 32.58 kg/m?  ? ?Visual Acuity ?Right Eye Distance:   ?Left Eye Distance:   ?Bilateral Distance:   ? ?Right Eye Near:   ?Left Eye Near:    ?Bilateral Near:    ? ?Physical Exam ?Constitutional:   ?   General: She is not in acute distress. ?   Appearance: Normal appearance. She is not toxic-appearing or diaphoretic.  ?HENT:  ?   Head: Normocephalic and atraumatic.  ?Eyes:  ?   Extraocular Movements: Extraocular movements intact.  ?   Conjunctiva/sclera: Conjunctivae normal.  ?Cardiovascular:  ?   Rate and Rhythm: Normal rate and regular rhythm.  ?   Pulses: Normal pulses.  ?   Heart sounds: Normal heart sounds.  ?Pulmonary:  ?   Effort: Pulmonary effort is normal. No respiratory distress.  ?   Breath sounds: Normal breath sounds.  ?Abdominal:  ?   General: Bowel sounds are normal. There is no distension.  ?   Palpations: Abdomen is soft.  ?   Tenderness: There is no abdominal tenderness.  ?Neurological:  ?   General: No focal deficit present.  ?   Mental Status: She is alert and oriented to person, place, and time. Mental status is at baseline.  ?Psychiatric:     ?   Mood and Affect: Mood normal.     ?   Behavior: Behavior normal.     ?   Thought Content: Thought content normal.     ?   Judgment: Judgment normal.  ? ? ? ?UC Treatments / Results  ?Labs ?(all labs ordered are listed, but only abnormal results are displayed) ?Labs Reviewed  ?POCT URINALYSIS DIP (MANUAL ENTRY) - Abnormal; Notable for the following components:  ?    Result Value  ? Blood, UA trace-intact (*)   ? All other components within normal limits  ?URINE CULTURE  ?CERVICOVAGINAL ANCILLARY ONLY  ? ? ?EKG ? ? ?Radiology ?No results found. ? ?Procedures ?Procedures (including critical care time) ? ?Medications Ordered in UC ?Medications - No data to display ? ?Initial Impression / Assessment and Plan / UC Course  ?I have  reviewed the triage vital signs and the nursing notes. ? ?Pertinent labs & imaging results that were available  during my care of the patient were reviewed by me and considered in my medical decision making (see chart for details). ? ?  ? ?Urinalysis not indicating urinary tract infection.  Suspect vaginitis as cause of symptoms and highly suspicious of bacterial vaginosis.  Will opt to empirically treat with metronidazole.  Urine culture and vaginal swab are pending.  Discussed return precautions.  Patient verbalized understanding and was agreeable with plan. ?Final Clinical Impressions(s) / UC Diagnoses  ? ?Final diagnoses:  ?Dysuria  ?Urinary urgency  ?Vaginal discharge  ? ? ? ?Discharge Instructions   ? ?  ?You are being treated with metronidazole antibiotic due to suspicion of bacterial vaginosis.  Please do not drink any alcohol with this medication.  Urine culture and vaginal swab are pending.  We will call if they are positive and treat as appropriate. ? ? ? ?ED Prescriptions   ? ? Medication Sig Dispense Auth. Provider  ? metroNIDAZOLE (FLAGYL) 500 MG tablet Take 1 tablet (500 mg total) by mouth 2 (two) times daily. 14 tablet Ervin KnackMound, Farha Dano E, [place]NP  ? ?  ? ?PDMP not reviewed this encounter. ?  ?Gustavus BryantMound, Caulin Begley E, [place]FNP ?06/28/21 1858 ? ?

## 2021-06-30 LAB — CERVICOVAGINAL ANCILLARY ONLY
Bacterial Vaginitis (gardnerella): NEGATIVE
Candida Glabrata: NEGATIVE
Candida Vaginitis: NEGATIVE
Comment: NEGATIVE
Comment: NEGATIVE
Comment: NEGATIVE

## 2021-06-30 LAB — URINE CULTURE: Culture: NO GROWTH

## 2021-10-09 ENCOUNTER — Ambulatory Visit (INDEPENDENT_AMBULATORY_CARE_PROVIDER_SITE_OTHER): Payer: 59

## 2021-10-09 ENCOUNTER — Encounter: Payer: Self-pay | Admitting: Podiatry

## 2021-10-09 ENCOUNTER — Ambulatory Visit: Payer: 59 | Admitting: Podiatry

## 2021-10-09 DIAGNOSIS — M779 Enthesopathy, unspecified: Secondary | ICD-10-CM | POA: Diagnosis not present

## 2021-10-09 DIAGNOSIS — B070 Plantar wart: Secondary | ICD-10-CM | POA: Diagnosis not present

## 2021-10-09 DIAGNOSIS — L84 Corns and callosities: Secondary | ICD-10-CM

## 2021-10-09 NOTE — Progress Notes (Signed)
Subjective:   Patient ID: Kimberly Hunter, female   DOB: 26 y.o.   MRN: [id number]   HPI Patient presents with painful lesion underneath the right foot and has had history of sesamoiditis and states this has been present for several months does not remember injury or stepping on anything.  States she has to walk on it and does have a 74-year-old she does not smoke likes to be active   Review of Systems  All other systems reviewed and are negative.       Objective:  Physical Exam Vitals and nursing note reviewed.  Constitutional:      Appearance: She is well-developed.  Pulmonary:     Effort: Pulmonary effort is normal.  Musculoskeletal:        General: Normal range of motion.  Skin:    General: Skin is warm.  Neurological:     Mental Status: She is alert.     Neurovascular status intact muscle strength adequate range of motion within normal with patient found to have a 5 x 5 mm lesion plantar aspect right first metatarsal just proximal to the sesamoid complex that shows pinpoint bleeding upon debridement pain to lateral pressure     Assessment:  Probability for verruca plantaris right cannot rule out sesamoid injury secondary     Plan:  H&P x-rays reviewed went ahead today did sterile debridement of the area no angiogenic bleeding and went ahead and applied chemical agent screening response with sterile dressing explained what to do if any blistering were to occur.  Patient to be seen back to be reevaluated may require excision depending on response  X-rays indicate normal appearance no indications of pathology as far as bone structure or sesamoidal structure

## 2021-10-25 ENCOUNTER — Other Ambulatory Visit: Payer: Self-pay | Admitting: Podiatry

## 2021-10-25 DIAGNOSIS — M779 Enthesopathy, unspecified: Secondary | ICD-10-CM

## 2022-11-01 LAB — OB RESULTS CONSOLE RUBELLA ANTIBODY, IGM: Rubella: IMMUNE

## 2022-11-01 LAB — OB RESULTS CONSOLE HEPATITIS B SURFACE ANTIGEN: Hepatitis B Surface Ag: NEGATIVE

## 2022-11-01 LAB — OB RESULTS CONSOLE HIV ANTIBODY (ROUTINE TESTING): HIV: NONREACTIVE

## 2023-02-26 ENCOUNTER — Encounter (HOSPITAL_COMMUNITY): Payer: Self-pay | Admitting: *Deleted

## 2023-02-26 ENCOUNTER — Inpatient Hospital Stay (HOSPITAL_COMMUNITY)
Admission: AD | Admit: 2023-02-26 | Discharge: 2023-02-26 | Disposition: A | Discharge status: Home / Self Care | Payer: 59 | Attending: Obstetrics and Gynecology | Admitting: Obstetrics and Gynecology

## 2023-02-26 DIAGNOSIS — Z3A24 24 weeks gestation of pregnancy: Secondary | ICD-10-CM | POA: Insufficient documentation

## 2023-02-26 DIAGNOSIS — O99512 Diseases of the respiratory system complicating pregnancy, second trimester: Secondary | ICD-10-CM | POA: Diagnosis not present

## 2023-02-26 DIAGNOSIS — O368120 Decreased fetal movements, second trimester, not applicable or unspecified: Secondary | ICD-10-CM | POA: Diagnosis present

## 2023-02-26 DIAGNOSIS — J00 Acute nasopharyngitis [common cold]: Secondary | ICD-10-CM | POA: Diagnosis present

## 2023-02-26 DIAGNOSIS — Z3689 Encounter for other specified antenatal screening: Secondary | ICD-10-CM

## 2023-02-26 DIAGNOSIS — J069 Acute upper respiratory infection, unspecified: Secondary | ICD-10-CM | POA: Insufficient documentation

## 2023-02-26 HISTORY — DX: Attention-deficit hyperactivity disorder, unspecified type: F90.9

## 2023-02-26 LAB — URINALYSIS, ROUTINE W REFLEX MICROSCOPIC
Bilirubin Urine: NEGATIVE
Glucose, UA: NEGATIVE mg/dL
Hgb urine dipstick: NEGATIVE
Ketones, ur: NEGATIVE mg/dL
Leukocytes,Ua: NEGATIVE
Nitrite: NEGATIVE
Protein, ur: NEGATIVE mg/dL
Specific Gravity, Urine: 1.018 (ref 1.005–1.030)
pH: 6 (ref 5.0–8.0)

## 2023-02-26 LAB — RESP PANEL BY RT-PCR (RSV, FLU A&B, COVID)  RVPGX2
Influenza A by PCR: NEGATIVE
Influenza B by PCR: NEGATIVE
Resp Syncytial Virus by PCR: NEGATIVE
SARS Coronavirus 2 by RT PCR: NEGATIVE

## 2023-02-26 NOTE — MAU Provider Note (Addendum)
History     CSN: [id number]  Arrival date and time: 02/26/23 1139   Event Date/Time   First Provider Initiated Contact with Patient 02/26/23 1239      Chief Complaint  Patient presents with   Decreased Fetal Movement   URI   Has had congestion and sore throat since Friday. Denies fevers, chills, myalgias, or cough. Has had shortness of breath throughout pregnancy and states this has not worsened over the past few days. Her son has been having similar symptoms. Took mucinex for the first time last night, thinks this helped somewhat. Has coughed up some yellowish mucus. Has been eating and drinking well. Denies nausea or vomiting.   States her primary concern is that she noticed decreased fetal movement last night. Then felt a strong kick before going to bed. Had not felt movement from then until this morning. Felt one movement before coming into the hospital. Denies abdominal pain or vaginal bleeding.    OB History     Gravida  3   Para  1   Term  1   Preterm      AB  1   Living  1      SAB  1   IAB      Ectopic      Multiple  0   Live Births  1           Past Medical History:  Diagnosis Date   ADHD    Anxiety    Asthma    last used inhaler in february   History of PCOS    IBS (irritable bowel syndrome)     Past Surgical History:  Procedure Laterality Date   WISDOM TOOTH EXTRACTION      Family History  Problem Relation Age of Onset   Diabetes Mother    Hypertension Mother    Hypercholesterolemia Mother    Hypertension Father    Diabetes Sister     Social History   Tobacco Use   Smoking status: Never   Smokeless tobacco: Never  Vaping Use   Vaping status: Never Used  Substance Use Topics   Alcohol use: Not Currently    Comment: occasionally   Drug use: Never    Allergies: No Known Allergies  Medications Prior to Admission  Medication Sig Dispense Refill Last Dose   aspirin EC 81 MG tablet Take 81 mg by mouth daily. Swallow  whole.   02/25/2023   doxylamine, Sleep, (UNISOM) 25 MG tablet Take 25 mg by mouth at bedtime as needed.   02/25/2023   Prenatal Vit-Fe Fumarate-FA (MULTIVITAMIN-PRENATAL) 27-0.8 MG TABS tablet Take 1 tablet by mouth daily at 12 noon.   02/24/2023   pyridOXINE (B-6) 50 MG tablet Take 50 mg by mouth daily.   02/25/2023   acetaminophen (TYLENOL) 500 MG tablet Take 500 mg by mouth every 8 (eight) hours as needed for mild pain.      albuterol (PROVENTIL HFA;VENTOLIN HFA) 108 (90 Base) MCG/ACT inhaler Inhale 1-2 puffs into the lungs every 4 (four) hours as needed for wheezing or shortness of breath (or cough). 1 Inhaler 0    docusate sodium (COLACE) 100 MG capsule Take 1 capsule (100 mg total) by mouth 2 (two) times daily. 60 capsule 3    ibuprofen (ADVIL) 800 MG tablet Take 1 tablet (800 mg total) by mouth every 8 (eight) hours as needed. 30 tablet 0    metroNIDAZOLE (FLAGYL) 500 MG tablet Take 1 tablet (500 mg total) by mouth  2 (two) times daily. 14 tablet 0    norethindrone (MICRONOR) 0.35 MG tablet Take 1 tablet (0.35 mg total) by mouth daily. 28 tablet 11    paragard intrauterine copper IUD IUD See admin instructions.      Prenatal Vit-Fe Fumarate-FA (PRENATAL MULTIVITAMIN) TABS tablet Take 2 tablets by mouth daily at 12 noon. Gummy vit      senna (SENOKOT) 8.6 MG TABS tablet Take 1 tablet (8.6 mg total) by mouth daily as needed for mild constipation. 120 tablet 0     Review of Systems Physical Exam   Blood pressure 127/75, pulse 97, temperature 98.4 F (36.9 C), temperature source Oral, resp. rate 18, height 5\' 7"  (1.702 m), weight 98.4 kg, SpO2 97%.  Physical Exam Gen: sitting up in bed, awake and conversant, in NAD Resp: CTAB, normal WOB on RA CV: RRR, normal S1/S2  MAU Course  Procedures  FHT reviewed- baseline 130 bpm with moderate variability, no accelerations or decelerations  MDM 27 year old G3P011 with nasal congestion and decreased fetal movement. Presentation most  consistent with viral URI, will check viral respiratory panel. Category I FHT reassuring for lack of fetal distress.  Assessment and Plan  Viral URI --continue mucinex PRN --tylenol as needed for sore throat, mild fever --pt declined treatment for COVID if test comes back positive. Will call patient with viral resp panel results  Concern for decreased fetal movement --reassuring FHT --follow up with OB as scheduled --reviewed MAU return precautions --stable for discharge home  Thos Matsumoto 02/26/2023, 1:10 PM

## 2023-02-26 NOTE — MAU Note (Signed)
Pt also reports at times it feels like her heart is racing. This has been happening for the last few days.

## 2023-02-26 NOTE — MAU Note (Signed)
.  Kimberly Hunter is a 27 y.o. at [redacted]w[redacted]d here in MAU reporting: cold symptoms x 4 days and reports decreased fetal movement since yesterday. Has felt movement just less than normal. Last felt movement on the walk in to hospital. Also reports lower abd pain off and on but none now.  Onset of complaint: 24 hours Pain score: 0/10 There were no vitals filed for this visit.   FHT:154 Lab orders placed from triage: ua

## 2023-03-21 ENCOUNTER — Inpatient Hospital Stay (HOSPITAL_COMMUNITY)
Admission: AD | Admit: 2023-03-21 | Discharge: 2023-03-21 | Disposition: A | Discharge status: Home / Self Care | Payer: 59 | Attending: Family Medicine | Admitting: Family Medicine

## 2023-03-21 ENCOUNTER — Encounter (HOSPITAL_COMMUNITY): Payer: Self-pay | Admitting: Obstetrics and Gynecology

## 2023-03-21 DIAGNOSIS — O09292 Supervision of pregnancy with other poor reproductive or obstetric history, second trimester: Secondary | ICD-10-CM | POA: Diagnosis not present

## 2023-03-21 DIAGNOSIS — O26892 Other specified pregnancy related conditions, second trimester: Secondary | ICD-10-CM | POA: Diagnosis not present

## 2023-03-21 DIAGNOSIS — R519 Headache, unspecified: Secondary | ICD-10-CM | POA: Diagnosis present

## 2023-03-21 DIAGNOSIS — R600 Localized edema: Secondary | ICD-10-CM | POA: Insufficient documentation

## 2023-03-21 DIAGNOSIS — M7989 Other specified soft tissue disorders: Secondary | ICD-10-CM | POA: Insufficient documentation

## 2023-03-21 DIAGNOSIS — O1202 Gestational edema, second trimester: Secondary | ICD-10-CM | POA: Diagnosis not present

## 2023-03-21 DIAGNOSIS — Z3A27 27 weeks gestation of pregnancy: Secondary | ICD-10-CM | POA: Insufficient documentation

## 2023-03-21 LAB — URINALYSIS, ROUTINE W REFLEX MICROSCOPIC
Bilirubin Urine: NEGATIVE
Glucose, UA: NEGATIVE mg/dL
Hgb urine dipstick: NEGATIVE
Ketones, ur: NEGATIVE mg/dL
Leukocytes,Ua: NEGATIVE
Nitrite: NEGATIVE
Protein, ur: NEGATIVE mg/dL
Specific Gravity, Urine: 1.01 (ref 1.005–1.030)
pH: 7 (ref 5.0–8.0)

## 2023-03-21 NOTE — Discharge Instructions (Signed)
It was great seeing you today.  Your blood pressures were completely normal on exam so I have not repeated the lab work.  I am glad that your headache resolved and be sure to take Excedrin tension if you have return of the headache.  Be sure to follow-up with your primary OB provider if your symptoms worsen.  If you have any other concerns feel free to return here for further evaluation.  I hope you have a wonderful afternoon!

## 2023-03-21 NOTE — MAU Provider Note (Signed)
History     CSN: [id number]  Arrival date and time: 03/21/23 1027   Event Date/Time   First Provider Initiated Contact with Patient 03/21/23 1118      Chief Complaint  Patient presents with   Headache   Foot Swelling   Facial Swelling   HPI Patient presenting for evaluation for swelling of her lower extremities bilaterally.  She reports that it is worse at night.  Also reports swelling in her right eyelid over the last week.  Also reports a headache which started yesterday and she took some Tylenol and it is completely resolved at this time.  Reports history of preeclampsia so she wanted to make sure that that is not what is going on.  Does not have a blood pressure cuff at home so has not taken her blood pressure.  OB History     Gravida  3   Para  1   Term  1   Preterm      AB  1   Living  1      SAB  1   IAB      Ectopic      Multiple  0   Live Births  1           Past Medical History:  Diagnosis Date   ADHD    Anxiety    Asthma    last used inhaler in february   History of PCOS    IBS (irritable bowel syndrome)     Past Surgical History:  Procedure Laterality Date   WISDOM TOOTH EXTRACTION      Family History  Problem Relation Age of Onset   Diabetes Mother    Hypertension Mother    Hypercholesterolemia Mother    Hypertension Father    Diabetes Sister     Social History   Tobacco Use   Smoking status: Never   Smokeless tobacco: Never  Vaping Use   Vaping status: Never Used  Substance Use Topics   Alcohol use: Not Currently    Comment: occasionally   Drug use: Never    Allergies: No Known Allergies  Medications Prior to Admission  Medication Sig Dispense Refill Last Dose/Taking   acetaminophen (TYLENOL) 500 MG tablet Take 500 mg by mouth every 8 (eight) hours as needed for mild pain.   03/21/2023   aspirin EC 81 MG tablet Take 81 mg by mouth daily. Swallow whole.   03/21/2023   doxylamine, Sleep, (UNISOM) 25 MG tablet  Take 25 mg by mouth at bedtime as needed.   03/21/2023   Prenatal Vit-Fe Fumarate-FA (MULTIVITAMIN-PRENATAL) 27-0.8 MG TABS tablet Take 1 tablet by mouth daily at 12 noon.   03/21/2023   pyridOXINE (B-6) 50 MG tablet Take 50 mg by mouth daily.   03/21/2023   albuterol (PROVENTIL HFA;VENTOLIN HFA) 108 (90 Base) MCG/ACT inhaler Inhale 1-2 puffs into the lungs every 4 (four) hours as needed for wheezing or shortness of breath (or cough). 1 Inhaler 0 More than a month   docusate sodium (COLACE) 100 MG capsule Take 1 capsule (100 mg total) by mouth 2 (two) times daily. 60 capsule 3     Review of Systems  Constitutional:  Negative for chills and fever.  HENT:  Negative for congestion, rhinorrhea, sinus pressure and sinus pain.   Eyes:        Eyelid swelling  Respiratory:  Negative for shortness of breath.   Cardiovascular:  Negative for chest pain.  Gastrointestinal:  Negative for abdominal  pain, diarrhea, nausea and vomiting.  Endocrine: Positive for polyuria.  Genitourinary:  Negative for vaginal bleeding and vaginal discharge.  Neurological:  Positive for headaches.   Physical Exam   Blood pressure 125/74, pulse 81, temperature 98.2 F (36.8 C), temperature source Oral, resp. rate 16, height 5\' 7"  (1.702 m), weight 97.5 kg, SpO2 100%.  Physical Exam Vitals and nursing note reviewed.  Constitutional:      Appearance: She is well-developed.  HENT:     Head: Normocephalic and atraumatic.  Eyes:     Extraocular Movements: Extraocular movements intact.     Comments: Not noticeable swelling in right eyelid but patient reports that it is noticeable to her.  I do not know baseline so difficult to determine  Cardiovascular:     Rate and Rhythm: Normal rate.  Pulmonary:     Effort: Pulmonary effort is normal.  Abdominal:     Comments: gravid  Musculoskeletal:        General: Swelling (trace lower extremity edema) present.     Cervical back: Normal range of motion.  Skin:    General:  Skin is warm.     Capillary Refill: Capillary refill takes less than 2 seconds.  Neurological:     Mental Status: She is alert. Mental status is at baseline.     Cranial Nerves: No cranial nerve deficit.  Psychiatric:        Mood and Affect: Mood normal.     MAU Course  Procedures  MDM Physical exam Vital signs   Assessment and Plan  Kimberly Hunter is a 27 yo G3P1011 @[redacted]w[redacted]d  presenting for lower extremity swelling, right eyelid swelling, headache.  Lower extremity swelling Right eyelid swelling Headache Patient presenting out of concern for possible preeclampsia.  Previous pregnancy with preeclampsia.  Patient has not checked blood pressures.  She had a headache this morning which started in her occipital region but took Tylenol and it is completely resolved at this time.  Trace lower extremity swelling present.  Likely physiologic swelling in pregnancy.  No other signs of preeclampsia.  Blood pressures are completely normal throughout entire stay.  Regarding the swelling in the right eyelid could be related to allergies.  Discussed daily Zyrtec to help with symptomatic management.  Discussed continued monitoring and patient will get blood pressure cuff.  Follow-up with primary OB provider.  Discussed strict return precautions and patient agreeable.  Celedonio Savage 03/21/2023, 11:56 AM

## 2023-03-21 NOTE — MAU Note (Signed)
.  Kimberly Hunter is a 27 y.o. at [redacted]w[redacted]d here in MAU reporting: Swollen right eye, swollen hands, swollen feet, and HA. She reports she woke up and noted her right eye was swollen. She reports for the past 1-2 weeks she has been experiencing swelling in her hands and feet that worsens at night. She reports a HA that began last night but she reports she was exhausted and contributed it to that. She reports when she woke up this morning her HA was still there so she took 650 mg of Tylenol around one hour ago and reports her HA is now a 2/10. Denies VB or LOF. +FM.  Patient was induced due to [facility] with her last pregnancy and developed pre-eclampsia with severe features (elevated Creatinine) but her BP's remained normal to mild range according to her d/c note. Patient received mag. Patient nervous about developing pre-E again.  Pain score: 2/10 HA  Vitals:   03/21/23 1109  BP: 116/62  Resp: 16  Temp: 98.2 F (36.8 C)  SpO2: 100%      FHT: 145 initial external Lab orders placed from triage: UA

## 2023-04-03 NOTE — L&D Delivery Note (Signed)
 Delivery Note At 6:42 AM a viable female was delivered via Vaginal, Spontaneous (Presentation:      Occiput Anterior).  APGAR: 7, 9; weight  .   Placenta status: Spontaneous, Intact.  Cord: 3 vessels with the following complications: None.  Cord pH: n/a.  McRoberts and posterior arm delivered.  Total 35secs.  Anesthesia:  Epidural Episiotomy: None Lacerations:  minor superficial abrasion Suture Repair:  n/a Est. Blood Loss (mL):  111  Mom to postpartum.  Baby to Couplet care / Skin to Skin.  Purcell Nails 06/11/2023, 6:56 AM

## 2023-05-20 ENCOUNTER — Inpatient Hospital Stay (HOSPITAL_COMMUNITY)
Admission: AD | Admit: 2023-05-20 | Discharge: 2023-05-20 | Disposition: A | Discharge status: Home / Self Care | Payer: 59 | Attending: Family Medicine | Admitting: Family Medicine

## 2023-05-20 ENCOUNTER — Other Ambulatory Visit: Payer: Self-pay

## 2023-05-20 ENCOUNTER — Encounter (HOSPITAL_COMMUNITY): Payer: Self-pay | Admitting: Family Medicine

## 2023-05-20 DIAGNOSIS — O99613 Diseases of the digestive system complicating pregnancy, third trimester: Secondary | ICD-10-CM | POA: Insufficient documentation

## 2023-05-20 DIAGNOSIS — Z3A35 35 weeks gestation of pregnancy: Secondary | ICD-10-CM | POA: Insufficient documentation

## 2023-05-20 DIAGNOSIS — A0811 Acute gastroenteropathy due to Norwalk agent: Secondary | ICD-10-CM | POA: Diagnosis not present

## 2023-05-20 DIAGNOSIS — R109 Unspecified abdominal pain: Secondary | ICD-10-CM | POA: Insufficient documentation

## 2023-05-20 DIAGNOSIS — O26893 Other specified pregnancy related conditions, third trimester: Secondary | ICD-10-CM | POA: Insufficient documentation

## 2023-05-20 LAB — LIPASE, BLOOD: Lipase: 25 U/L (ref 11–51)

## 2023-05-20 LAB — CBC
HCT: 40.3 % (ref 36.0–46.0)
Hemoglobin: 13.5 g/dL (ref 12.0–15.0)
MCH: 30.6 pg (ref 26.0–34.0)
MCHC: 33.5 g/dL (ref 30.0–36.0)
MCV: 91.4 fL (ref 80.0–100.0)
Platelets: 199 10*3/uL (ref 150–400)
RBC: 4.41 MIL/uL (ref 3.87–5.11)
RDW: 14.7 % (ref 11.5–15.5)
WBC: 15.5 10*3/uL — ABNORMAL HIGH (ref 4.0–10.5)
nRBC: 0 % (ref 0.0–0.2)

## 2023-05-20 LAB — COMPREHENSIVE METABOLIC PANEL
ALT: 13 U/L (ref 0–44)
AST: 25 U/L (ref 15–41)
Albumin: 3 g/dL — ABNORMAL LOW (ref 3.5–5.0)
Alkaline Phosphatase: 92 U/L (ref 38–126)
Anion gap: 11 (ref 5–15)
BUN: 6 mg/dL (ref 6–20)
CO2: 19 mmol/L — ABNORMAL LOW (ref 22–32)
Calcium: 8.6 mg/dL — ABNORMAL LOW (ref 8.9–10.3)
Chloride: 108 mmol/L (ref 98–111)
Creatinine, Ser: 0.56 mg/dL (ref 0.44–1.00)
GFR, Estimated: >60 mL/min (ref >60)
Glucose, Bld: 106 mg/dL — ABNORMAL HIGH (ref 70–99)
Potassium: 4.2 mmol/L (ref 3.5–5.1)
Sodium: 138 mmol/L (ref 135–145)
Total Bilirubin: 0.9 mg/dL (ref 0.0–1.2)
Total Protein: 6 g/dL — ABNORMAL LOW (ref 6.5–8.1)

## 2023-05-20 LAB — URINALYSIS, ROUTINE W REFLEX MICROSCOPIC
Bilirubin Urine: NEGATIVE
Glucose, UA: NEGATIVE mg/dL
Hgb urine dipstick: NEGATIVE
Ketones, ur: 80 mg/dL — AB
Leukocytes,Ua: NEGATIVE
Nitrite: NEGATIVE
Protein, ur: 100 mg/dL — AB
Specific Gravity, Urine: 1.029 (ref 1.005–1.030)
pH: 5 (ref 5.0–8.0)

## 2023-05-20 LAB — AMYLASE: Amylase: 51 U/L (ref 28–100)

## 2023-05-20 MED ORDER — FAMOTIDINE IN NACL 20-0.9 MG/50ML-% IV SOLN
20.0000 mg | Freq: Once | INTRAVENOUS | Status: AC
  Administered 2023-05-20: 20 mg via INTRAVENOUS
  Filled 2023-05-20: qty 50

## 2023-05-20 MED ORDER — ONDANSETRON 4 MG PO TBDP: 4.0000 mg | ORAL_TABLET | Freq: Three times a day (TID) | ORAL | 0 refills | Status: AC | PRN

## 2023-05-20 MED ORDER — ONDANSETRON HCL 4 MG/2ML IJ SOLN
INTRAMUSCULAR | Status: AC
  Administered 2023-05-20: 4 mg via INTRAVENOUS
  Filled 2023-05-20: qty 2

## 2023-05-20 MED ORDER — ONDANSETRON HCL 4 MG/2ML IJ SOLN: 4.0000 mg | Freq: Once | INTRAMUSCULAR | Status: DC

## 2023-05-20 MED ORDER — SODIUM CHLORIDE 0.9 % IV SOLN
25.0000 mg | Freq: Once | INTRAVENOUS | Status: AC
  Administered 2023-05-20: 25 mg via INTRAVENOUS
  Filled 2023-05-20: qty 25

## 2023-05-20 MED ORDER — LACTATED RINGERS IV BOLUS: 1000.0000 mL | Freq: Once | INTRAVENOUS | Status: DC

## 2023-05-20 MED ORDER — LACTATED RINGERS IV BOLUS
1000.0000 mL | Freq: Once | INTRAVENOUS | Status: AC
  Administered 2023-05-20: 1000 mL via INTRAVENOUS

## 2023-05-20 MED ORDER — ONDANSETRON HCL 4 MG/2ML IJ SOLN: 4.0000 mg | Freq: Once | INTRAMUSCULAR | Status: AC

## 2023-05-20 MED ORDER — ONDANSETRON HCL 4 MG/2ML IJ SOLN
4.0000 mg | Freq: Once | INTRAMUSCULAR | Status: AC
  Administered 2023-05-20: 4 mg via INTRAVENOUS
  Filled 2023-05-20: qty 2

## 2023-05-20 NOTE — MAU Note (Signed)
.  Kimberly Hunter is a 28 y.o. at [redacted]w[redacted]d here in MAU reporting: she has N/V/D that began this morning @ 0100.  Reports she's unable to keep fluids including water down.  Reports has vomited too numerous to count and had at least 10 episodes of diarrhea.  States also having constant abdominal cramping.  Denies VB or LOF.  Endorses +FM.  LMP: NA Onset of complaint: today Pain score: 7 Vitals:   05/20/23 0740  BP: 122/76  Pulse: (!) 122  Resp: 18  Temp: 98.2 F (36.8 C)  SpO2: 100%     FHT: 151 bpm  Lab orders placed from triage: UA

## 2023-05-20 NOTE — Progress Notes (Signed)
Patient extremely drowsy upon discharge. Husband educated on discharge instructions; he voiced understanding and I answered all questions. Husband retrieving car, will transport to car in wheelchair.

## 2023-05-20 NOTE — MAU Provider Note (Signed)
History     CSN: [id number]  Arrival date and time: 05/20/23 5621   Event Date/Time   First Provider Initiated Contact with Patient 05/20/23 [phone]      Chief Complaint  Patient presents with   Cramping   Emesis   Diarrhea   HPI Ms. Kimberly Hunter is a 28 y.o. year old G62P1011 female at [redacted]w[redacted]d weeks gestation who presents to MAU reporting N/V/D that started 1 AM this morning.  She is unable to keep fluids down; even water.  She reports that she has vomited "too numerous to count" and reports at least 10 episodes of diarrhea since this morning at 1 AM.  She reports constant abdominal cramping; rated 7/10.  She denies vaginal bleeding or loss of fluid.  She reports positive fetal movement.  She receives prenatal care at [facility]; next appt was scheduled for later today. She has canceled it. Her spouse is present and contributing to the history taking.   OB History     Gravida  3   Para  1   Term  1   Preterm      AB  1   Living  1      SAB  1   IAB      Ectopic      Multiple  0   Live Births  1           Past Medical History:  Diagnosis Date   ADHD    Anxiety    Asthma    last used inhaler in february   History of PCOS    IBS (irritable bowel syndrome)     Past Surgical History:  Procedure Laterality Date   WISDOM TOOTH EXTRACTION      Family History  Problem Relation Age of Onset   Diabetes Mother    Hypertension Mother    Hypercholesterolemia Mother    Hypertension Father    Diabetes Sister     Social History   Tobacco Use   Smoking status: Never   Smokeless tobacco: Never  Vaping Use   Vaping status: Never Used  Substance Use Topics   Alcohol use: Not Currently    Comment: occasionally   Drug use: Never    Allergies: No Known Allergies  Medications Prior to Admission  Medication Sig Dispense Refill Last Dose/Taking   acetaminophen (TYLENOL) 500 MG tablet Take 500 mg by mouth every 8 (eight) hours as needed for mild  pain.      albuterol (PROVENTIL HFA;VENTOLIN HFA) 108 (90 Base) MCG/ACT inhaler Inhale 1-2 puffs into the lungs every 4 (four) hours as needed for wheezing or shortness of breath (or cough). 1 Inhaler 0    aspirin EC 81 MG tablet Take 81 mg by mouth daily. Swallow whole.      docusate sodium (COLACE) 100 MG capsule Take 1 capsule (100 mg total) by mouth 2 (two) times daily. 60 capsule 3    doxylamine, Sleep, (UNISOM) 25 MG tablet Take 25 mg by mouth at bedtime as needed.      Prenatal Vit-Fe Fumarate-FA (MULTIVITAMIN-PRENATAL) 27-0.8 MG TABS tablet Take 1 tablet by mouth daily at 12 noon.      pyridOXINE (B-6) 50 MG tablet Take 50 mg by mouth daily.       Review of Systems  Constitutional:  Positive for appetite change and fatigue.  HENT: Negative.    Eyes: Negative.   Respiratory: Negative.    Cardiovascular: Negative.   Gastrointestinal:  Positive for  abdominal pain, diarrhea ("at least 10 episodes since 0100"), nausea and vomiting ("too numerous to count").  Endocrine: Negative.   Genitourinary: Negative.   Musculoskeletal: Negative.   Skin: Negative.   Allergic/Immunologic: Negative.   Neurological: Negative.   Hematological: Negative.   Psychiatric/Behavioral: Negative.     Physical Exam   Patient Vitals for the past 24 hrs:  BP Temp Temp src Pulse Resp SpO2 Height Weight  05/20/23 1224 (!) 109/53 99.3 F (37.4 C) Oral (!) 114 16 -- -- --  05/20/23 0802 106/68 -- -- (!) 115 -- -- -- --  05/20/23 0740 122/76 98.2 F (36.8 C) Oral (!) 122 18 100 % -- --  05/20/23 0734 -- -- -- -- -- -- 5\' 7"  (1.702 m) 104.5 kg     Physical Exam Vitals and nursing note reviewed.  Constitutional:      Appearance: Normal appearance. She is obese.  Cardiovascular:     Rate and Rhythm: Tachycardia present.  Pulmonary:     Effort: Pulmonary effort is normal.  Genitourinary:    Comments: Not indicated Musculoskeletal:        General: Normal range of motion.  Neurological:     Mental  Status: She is alert and oriented to person, place, and time.  Psychiatric:        Mood and Affect: Mood normal.        Behavior: Behavior normal.        Thought Content: Thought content normal.        Judgment: Judgment normal.     REACTIVE NST - FHR: 130 bpm / moderate variability / accels present / decels absent / TOCO: UI noted  Reassessment @ 1210: Patient feeling a little better, but states she is starting to feel nauseated again. Orders entered for more Zofran prior to d/c home.  MAU Course  Procedures  MDM CCUA Start IV IVFs: Phenergan 25 mg in LR 1000 ml @ 999 ml/hr  -- resolved nausea/vomiting Zofran 4 mg IVP  -- improved nausea/vomiting  Zofran 4 mg IVP #2 -- improved N/V Pepcid 20 mg IVPB PO Challenge -- patient tolerated well   CBC w/Diff CMP Lipase  Amylase  Results for orders placed or performed during the hospital encounter of 05/20/23 (from the past 24 hours)  Urinalysis, Routine w reflex microscopic -Urine, Clean Catch     Status: Abnormal   Collection Time: 05/20/23  7:59 AM  Result Value Ref Range   Color, Urine AMBER (A) YELLOW   APPearance HAZY (A) CLEAR   Specific Gravity, Urine 1.029 1.005 - 1.030   pH 5.0 5.0 - 8.0   Glucose, UA NEGATIVE NEGATIVE mg/dL   Hgb urine dipstick NEGATIVE NEGATIVE   Bilirubin Urine NEGATIVE NEGATIVE   Ketones, ur 80 (A) NEGATIVE mg/dL   Protein, ur 161 (A) NEGATIVE mg/dL   Nitrite NEGATIVE NEGATIVE   Leukocytes,Ua NEGATIVE NEGATIVE   RBC / HPF 0-5 0 - 5 RBC/hpf   WBC, UA 0-5 0 - 5 WBC/hpf   Bacteria, UA RARE (A) NONE SEEN   Squamous Epithelial / HPF 11-20 0 - 5 /HPF   Mucus PRESENT   CBC     Status: Abnormal   Collection Time: 05/20/23  8:58 AM  Result Value Ref Range   WBC 15.5 (H) 4.0 - 10.5 K/uL   RBC 4.41 3.87 - 5.11 MIL/uL   Hemoglobin 13.5 12.0 - 15.0 g/dL   HCT 09.6 04.5 - 40.9 %   MCV 91.4 80.0 - 100.0 fL  MCH 30.6 26.0 - 34.0 pg   MCHC 33.5 30.0 - 36.0 g/dL   RDW 78.2 95.6 - 21.3 %   Platelets  199 150 - 400 K/uL   nRBC 0.0 0.0 - 0.2 %  Comprehensive metabolic panel     Status: Abnormal   Collection Time: 05/20/23  8:58 AM  Result Value Ref Range   Sodium 138 135 - 145 mmol/L   Potassium 4.2 3.5 - 5.1 mmol/L   Chloride 108 98 - 111 mmol/L   CO2 19 (L) 22 - 32 mmol/L   Glucose, Bld 106 (H) 70 - 99 mg/dL   BUN 6 6 - 20 mg/dL   Creatinine, Ser 0.86 0.44 - 1.00 mg/dL   Calcium 8.6 (L) 8.9 - 10.3 mg/dL   Total Protein 6.0 (L) 6.5 - 8.1 g/dL   Albumin 3.0 (L) 3.5 - 5.0 g/dL   AST 25 15 - 41 U/L   ALT 13 0 - 44 U/L   Alkaline Phosphatase 92 38 - 126 U/L   Total Bilirubin 0.9 0.0 - 1.2 mg/dL   GFR, Estimated >57 >84 mL/min   Anion gap 11 5 - 15  Lipase, blood     Status: None   Collection Time: 05/20/23  8:58 AM  Result Value Ref Range   Lipase 25 11 - 51 U/L  Amylase     Status: None   Collection Time: 05/20/23  8:58 AM  Result Value Ref Range   Amylase 51 28 - 100 U/L    Assessment and Plan  1. Norovirus (Primary) - Information provided on norovirus and bland diet - Advised to advance diet slowly - Advised to bleach as many hard surfaces at home as possible, wash hands frequently and wear a mask if has to be out in public  2. [redacted] weeks gestation of pregnancy   - Discharge patient - Reschedule appt for today to next week some time OOW until next Monday - Patient verbalized an understanding of the plan of care and agrees.   Raelyn Mora, CNM 05/20/2023, 8:52 AM

## 2023-05-29 LAB — OB RESULTS CONSOLE GBS: GBS: NEGATIVE

## 2023-06-09 ENCOUNTER — Encounter (HOSPITAL_COMMUNITY): Payer: Self-pay | Admitting: Obstetrics and Gynecology

## 2023-06-09 ENCOUNTER — Inpatient Hospital Stay (HOSPITAL_COMMUNITY)
Admission: AD | Admit: 2023-06-09 | Discharge: 2023-06-12 | DRG: 807 | Disposition: A | Discharge status: Home / Self Care | Source: Ambulatory Visit | Attending: Obstetrics and Gynecology | Admitting: Obstetrics and Gynecology

## 2023-06-09 DIAGNOSIS — O4292 Full-term premature rupture of membranes, unspecified as to length of time between rupture and onset of labor: Secondary | ICD-10-CM | POA: Diagnosis present

## 2023-06-09 DIAGNOSIS — O99214 Obesity complicating childbirth: Secondary | ICD-10-CM | POA: Diagnosis present

## 2023-06-09 DIAGNOSIS — Z3A38 38 weeks gestation of pregnancy: Secondary | ICD-10-CM | POA: Diagnosis not present

## 2023-06-09 DIAGNOSIS — Z7982 Long term (current) use of aspirin: Secondary | ICD-10-CM | POA: Diagnosis not present

## 2023-06-09 DIAGNOSIS — Z833 Family history of diabetes mellitus: Secondary | ICD-10-CM

## 2023-06-09 DIAGNOSIS — J45909 Unspecified asthma, uncomplicated: Secondary | ICD-10-CM | POA: Diagnosis present

## 2023-06-09 DIAGNOSIS — O9952 Diseases of the respiratory system complicating childbirth: Secondary | ICD-10-CM | POA: Diagnosis present

## 2023-06-09 DIAGNOSIS — Z8249 Family history of ischemic heart disease and other diseases of the circulatory system: Secondary | ICD-10-CM

## 2023-06-09 DIAGNOSIS — O26893 Other specified pregnancy related conditions, third trimester: Secondary | ICD-10-CM | POA: Diagnosis present

## 2023-06-09 LAB — POCT FERN TEST: POCT Fern Test: POSITIVE

## 2023-06-09 MED ORDER — LIDOCAINE HCL (PF) 1 % IJ SOLN: 30.0000 mL | INTRAMUSCULAR | Status: DC | PRN

## 2023-06-09 MED ORDER — ONDANSETRON HCL 4 MG/2ML IJ SOLN
4.0000 mg | Freq: Four times a day (QID) | INTRAMUSCULAR | Status: DC | PRN
  Administered 2023-06-11: 4 mg via INTRAVENOUS
  Filled 2023-06-09: qty 2

## 2023-06-09 MED ORDER — ACETAMINOPHEN 325 MG PO TABS: 650.0000 mg | ORAL_TABLET | ORAL | Status: DC | PRN

## 2023-06-09 MED ORDER — OXYTOCIN-SODIUM CHLORIDE 30-0.9 UT/500ML-% IV SOLN: 2.5000 [IU]/h | INTRAVENOUS | Status: DC

## 2023-06-09 MED ORDER — LACTATED RINGERS IV SOLN: 500.0000 mL | INTRAVENOUS | Status: AC | PRN

## 2023-06-09 MED ORDER — SOD CITRATE-CITRIC ACID 500-334 MG/5ML PO SOLN: 30.0000 mL | ORAL | Status: DC | PRN

## 2023-06-09 MED ORDER — OXYTOCIN BOLUS FROM INFUSION
333.0000 mL | Freq: Once | INTRAVENOUS | Status: AC
  Administered 2023-06-11: 333 mL via INTRAVENOUS

## 2023-06-09 NOTE — H&P (Signed)
 OBSTETRIC ADMISSION HISTORY AND PHYSICAL  Kimberly Hunter is a 28 y.o. female [id number] with IUP at [redacted]w[redacted]d by LMP presenting for ROM 3/9 @2PM  while rising from using the restroom she felt a large gush and continued to have this happen with every bathroom trip. She was advised to come in and was fern+ in the MAU. She reports +FMs, no VB, no CTX, no blurry vision, headaches or peripheral edema, and RUQ pain.  She plans on breast feeding. She is considering paragard IUD.  She received her prenatal care at [facility] Steva Ready, DO)  Dating: By LMP --->  Estimated Date of Delivery: 06/18/23  Sono:    @[redacted]w[redacted]d , CWD, normal anatomy, cephalic presentation, anterior placenta lie, 7lbs 3oz, 75% EFW   Prenatal History/Complications:  -Size>dates; normal growth -Obesity -Hx of GDM -ADHD -Asthma -PCOS  Past Medical History: Past Medical History:  Diagnosis Date   ADHD    Anxiety    Asthma    last used inhaler in february   History of PCOS    IBS (irritable bowel syndrome)     Past Surgical History: Past Surgical History:  Procedure Laterality Date   WISDOM TOOTH EXTRACTION      Obstetrical History: OB History     Gravida  3   Para  1   Term  1   Preterm      AB  1   Living  1      SAB  1   IAB      Ectopic      Multiple  0   Live Births  1           Social History Social History   Socioeconomic History   Marital status: Married    Spouse name: Not on file   Number of children: Not on file   Years of education: Not on file   Highest education level: Not on file  Occupational History   Not on file  Tobacco Use   Smoking status: Never   Smokeless tobacco: Never  Vaping Use   Vaping status: Never Used  Substance and Sexual Activity   Alcohol use: Not Currently    Comment: occasionally   Drug use: Never   Sexual activity: Yes    Birth control/protection: None  Other Topics Concern   Not on file  Social History Narrative   Not on file    Social Drivers of Health   Financial Resource Strain: Not on file  Food Insecurity: No Food Insecurity (05/20/2023)   Hunger Vital Sign    Worried About Running Out of Food in the Last Year: Never true    Ran Out of Food in the Last Year: Never true  Transportation Needs: No Transportation Needs (05/20/2023)   PRAPARE - Administrator, Civil Service (Medical): No    Lack of Transportation (Non-Medical): No  Physical Activity: Not on file  Stress: Not on file  Social Connections: Not on file    Family History: Family History  Problem Relation Age of Onset   Diabetes Mother    Hypertension Mother    Hypercholesterolemia Mother    Hypertension Father    Diabetes Sister     Allergies: No Known Allergies  Medications Prior to Admission  Medication Sig Dispense Refill Last Dose/Taking   acetaminophen (TYLENOL) 500 MG tablet Take 500 mg by mouth every 8 (eight) hours as needed for mild pain.   Past Week   aspirin EC 81 MG tablet  Take 81 mg by mouth daily. Swallow whole.   06/08/2023   docusate sodium (COLACE) 100 MG capsule Take 1 capsule (100 mg total) by mouth 2 (two) times daily. 60 capsule 3 Past Week   doxylamine, Sleep, (UNISOM) 25 MG tablet Take 25 mg by mouth at bedtime as needed.   06/08/2023   magnesium 30 MG tablet Take 400 mg by mouth.   06/08/2023   ondansetron (ZOFRAN-ODT) 4 MG disintegrating tablet Take 1 tablet (4 mg total) by mouth every 8 (eight) hours as needed for nausea or vomiting. 15 tablet 0 Past Month   Prenatal Vit-Fe Fumarate-FA (MULTIVITAMIN-PRENATAL) 27-0.8 MG TABS tablet Take 1 tablet by mouth daily at 12 noon.   06/08/2023   pyridOXINE (B-6) 50 MG tablet Take 50 mg by mouth daily.   06/08/2023   albuterol (PROVENTIL HFA;VENTOLIN HFA) 108 (90 Base) MCG/ACT inhaler Inhale 1-2 puffs into the lungs every 4 (four) hours as needed for wheezing or shortness of breath (or cough). 1 Inhaler 0      Review of Systems   All systems reviewed and negative  except as stated in HPI  Blood pressure 128/76, pulse (!) 108, temperature 98.6 F (37 C), temperature source Oral, resp. rate 18, height 5\' 7"  (1.702 m), weight 106 kg, SpO2 99%. General appearance: alert and no distress Lungs: normal effort Heart: regular rate noted Abdomen: appropriately gravid for gestational age  Fetal monitoringBaseline: 130 bpm, Variability: Good {> 6 bpm), Accelerations: Reactive, and Decelerations: Absent Uterine activityFrequency: Every 1-4 minutes     Prenatal labs: ABO, Rh:  A+ Antibody:  Neg Rubella:  Immune RPR:   NR HBsAg:   nEG HIV:   NR GBS: Negative/-- (02/26 0000)  1 hr Glucola 158, nml 3hr Genetic screening  LR Anatomy US wnl  Prenatal Transfer Tool  Maternal Diabetes: No Genetic Screening: Normal Maternal Ultrasounds/Referrals: Normal Fetal Ultrasounds or other Referrals:  None Maternal Substance Abuse:  No Significant Maternal Medications:  None Significant Maternal Lab Results:  Group B Strep negative Number of Prenatal Visits:greater than 3 verified prenatal visits Other Comments:  None  Results for orders placed or performed during the hospital encounter of 06/09/23 (from the past 24 hours)  POCT fern test   Collection Time: 06/09/23 10:54 PM  Result Value Ref Range   POCT Fern Test Positive = ruptured amniotic membanes     Patient Active Problem List   Diagnosis Date Noted   Indication for care in labor and delivery, antepartum 06/09/2023    Assessment/Plan:  Kimberly Hunter is a 28 y.o. G3P1011 at [redacted]w[redacted]d here for SROM 3/9 @2PM .  #Labor: Start pitocin when appropriate #Pain: Planning natural labor #FWB: Cat I  #ID: GBS neg #MOF: Breast #MOC: unsure, considering paragard IUD #Circ: yes  Kimberly Breiner Autry-Lott, DO  06/09/2023, 11:13 PM

## 2023-06-09 NOTE — MAU Note (Signed)
 Kimberly Hunter is a 28 y.o. at [redacted]w[redacted]d here in MAU reporting: Possible ROM at 1430 today. Pt states she went to use the bathroom and when she stood up she had clear fluid running down her legs. Pt states every time she squats low or stands up she feels more fluid come out. Pt did not wear a pad into MAU today. Pt reports irregular ctx that are 2/10 in pain. +FM. Pt denies VB   Onset of complaint: 1430 Pain score: 2/10 lower abdomen  Vitals:   06/09/23 2251  BP: 128/76  Pulse: (!) 108  Resp: 18  Temp: 98.6 F (37 C)  SpO2: 100%     FHT:140 Lab orders placed from triage:  mau labor

## 2023-06-09 NOTE — MAU Note (Signed)
 Pt informed that the ultrasound is considered a limited OB ultrasound and is not intended to be a complete ultrasound exam.  Patient also informed that the ultrasound is not being completed with the intent of assessing for fetal or placental anomalies or any pelvic abnormalities.  Explained that the purpose of today's ultrasound is to assess for  presentation.  Patient acknowledges the purpose of the exam and the limitations of the study.     Vertex verified by certified RN

## 2023-06-10 ENCOUNTER — Other Ambulatory Visit: Payer: Self-pay

## 2023-06-10 LAB — TYPE AND SCREEN

## 2023-06-10 LAB — CBC
HCT: 36.4 % (ref 36.0–46.0)
Hemoglobin: 11.7 g/dL — ABNORMAL LOW (ref 12.0–15.0)
MCH: 30.2 pg (ref 26.0–34.0)
MCHC: 32.1 g/dL (ref 30.0–36.0)
MCV: 93.8 fL (ref 80.0–100.0)
Platelets: 222 10*3/uL (ref 150–400)
RBC: 3.88 MIL/uL (ref 3.87–5.11)
RDW: 15 % (ref 11.5–15.5)
WBC: 10.8 10*3/uL — ABNORMAL HIGH (ref 4.0–10.5)
nRBC: 0 % (ref 0.0–0.2)

## 2023-06-10 LAB — RPR: RPR Ser Ql: NONREACTIVE

## 2023-06-10 MED ORDER — TERBUTALINE SULFATE 1 MG/ML IJ SOLN: 0.2500 mg | Freq: Once | INTRAMUSCULAR | Status: DC | PRN

## 2023-06-10 MED ORDER — LACTATED RINGERS IV SOLN
500.0000 mL | Freq: Once | INTRAVENOUS | Status: AC
Start: 2023-06-10 — End: 2023-06-11
  Administered 2023-06-11: 500 mL via INTRAVENOUS

## 2023-06-10 MED ORDER — DIPHENHYDRAMINE HCL 50 MG/ML IJ SOLN: 12.5000 mg | INTRAMUSCULAR | Status: DC | PRN

## 2023-06-10 MED ORDER — EPHEDRINE 5 MG/ML INJ: 10.0000 mg | INTRAVENOUS | Status: DC | PRN

## 2023-06-10 MED ORDER — FENTANYL-BUPIVACAINE-NACL 0.5-0.125-0.9 MG/250ML-% EP SOLN
12.0000 mL/h | EPIDURAL | Status: DC | PRN
  Administered 2023-06-11: 12 mL/h via EPIDURAL
  Filled 2023-06-10: qty 250

## 2023-06-10 MED ORDER — PHENYLEPHRINE 80 MCG/ML (10ML) SYRINGE FOR IV PUSH (FOR BLOOD PRESSURE SUPPORT): 80.0000 ug | PREFILLED_SYRINGE | INTRAVENOUS | Status: DC | PRN

## 2023-06-10 MED ORDER — DOXYLAMINE SUCCINATE (SLEEP) 25 MG PO TABS
25.0000 mg | ORAL_TABLET | Freq: Every evening | ORAL | Status: DC | PRN
  Administered 2023-06-10: 25 mg via ORAL
  Filled 2023-06-10 (×2): qty 1

## 2023-06-10 MED ORDER — OXYTOCIN-SODIUM CHLORIDE 30-0.9 UT/500ML-% IV SOLN
1.0000 m[IU]/min | INTRAVENOUS | Status: DC
  Administered 2023-06-10 (×2): 2 m[IU]/min via INTRAVENOUS
  Administered 2023-06-11: 6 m[IU]/min via INTRAVENOUS
  Filled 2023-06-10 (×2): qty 500

## 2023-06-10 MED ORDER — FENTANYL CITRATE (PF) 100 MCG/2ML IJ SOLN
100.0000 ug | INTRAMUSCULAR | Status: DC | PRN
  Administered 2023-06-10 – 2023-06-11 (×2): 100 ug via INTRAVENOUS
  Filled 2023-06-10 (×2): qty 2

## 2023-06-10 MED ORDER — MISOPROSTOL 25 MCG QUARTER TABLET
25.0000 ug | ORAL_TABLET | Freq: Once | ORAL | Status: AC
  Administered 2023-06-10: 25 ug via VAGINAL
  Filled 2023-06-10: qty 1

## 2023-06-10 MED ORDER — MISOPROSTOL 50MCG HALF TABLET
50.0000 ug | ORAL_TABLET | Freq: Once | ORAL | Status: AC
  Administered 2023-06-10: 50 ug via ORAL
  Filled 2023-06-10: qty 1

## 2023-06-10 MED FILL — Oxytocin-Sodium Chloride 0.9% IV Soln 30 Unit/500ML: INTRAVENOUS | Qty: 500 | Status: AC

## 2023-06-10 NOTE — Progress Notes (Signed)
  Subjective:  Patient reports she is not currently feeling contractions. She is on 16 mU of pitocin. Continued LOF . No vaginal bleeding. +FM.   Objective: BP 129/75   Pulse (!) 108   Temp 98.2 F (36.8 C) (Oral)   Resp 16   Ht 5\' 7"  (1.702 m)   Wt 104.3 kg   SpO2 99%   BMI 36.02 kg/m  No intake/output data recorded. No intake/output data recorded.  FHT:  FHR: 120 bpm, variability: moderate,  accelerations:  Present,  decelerations:  Absent UC:   irregular, every 2-5 minutes SVE:   Dilation: 1.5 Effacement (%): 60 Station: -3 Exam by:: Dr Richardson Dopp  Labs: Lab Results  Component Value Date   WBC 10.8 (H) 06/09/2023   HGB 11.7 (L) 06/09/2023   HCT 36.4 06/09/2023   MCV 93.8 06/09/2023   PLT 222 06/09/2023    Assessment / Plan: Latent labor with SROM  Labor:  discontinue pitocin given lack of cervical change on 16 mu of pitocin. SHe needs additional cervical ripening. Plan 50 mcg of cytotec oral. Reassess in 4 hours.  Preeclampsia:   NA Fetal Wellbeing:  Category I Pain Control:  IV pain meds I/D:  n/a Anticipated MOD:  NSVD  Gerald Leitz, MD 06/10/2023, 6:22 PM

## 2023-06-10 NOTE — Progress Notes (Signed)
 Remote review: Labor Progress Note Kimberly Hunter is a 28 y.o. G3P1011 at [redacted]w[redacted]d presented for PROM.   O:  BP 122/73   Pulse (!) 102   Temp 97.9 F (36.6 C) (Oral)   Resp 16   Ht 5\' 7"  (1.702 m)   Wt 104.3 kg   SpO2 99%   BMI 36.02 kg/m  EFM: 115bpm/moderate/+accels, no decels  CVE: Dilation: 1 Effacement (%): 60 Station: -3 Presentation: Vertex Exam by:: Hipolito Bayley, RN   A&P: 28 y.o. [id number] [redacted]w[redacted]d here for PROM @2pm  on 3/9 #Labor: s/p vagincal cytotec @0156 . Consider starting pitocin 2x2 at next cervical exam.  #Pain: Maternally supported #FWB: Cat I  #GBS negative  Maikol Grassia Autry-Lott, DO 4:39 AM

## 2023-06-11 ENCOUNTER — Inpatient Hospital Stay (HOSPITAL_COMMUNITY)

## 2023-06-11 ENCOUNTER — Encounter (HOSPITAL_COMMUNITY): Payer: Self-pay | Admitting: Family Medicine

## 2023-06-11 MED ORDER — WITCH HAZEL-GLYCERIN EX PADS: 1.0000 | MEDICATED_PAD | CUTANEOUS | Status: DC | PRN

## 2023-06-11 MED ORDER — IBUPROFEN 600 MG PO TABS
600.0000 mg | ORAL_TABLET | Freq: Four times a day (QID) | ORAL | Status: DC
  Administered 2023-06-11 – 2023-06-12 (×5): 600 mg via ORAL
  Filled 2023-06-11 (×5): qty 1

## 2023-06-11 MED ORDER — OXYCODONE HCL 5 MG PO TABS: 5.0000 mg | ORAL_TABLET | ORAL | Status: DC | PRN

## 2023-06-11 MED ORDER — TETANUS-DIPHTH-ACELL PERTUSSIS 5-2.5-18.5 LF-MCG/0.5 IM SUSY: 0.5000 mL | PREFILLED_SYRINGE | Freq: Once | INTRAMUSCULAR | Status: DC

## 2023-06-11 MED ORDER — DIPHENHYDRAMINE HCL 25 MG PO CAPS
25.0000 mg | ORAL_CAPSULE | Freq: Four times a day (QID) | ORAL | Status: DC | PRN
  Administered 2023-06-11: 25 mg via ORAL
  Filled 2023-06-11: qty 1

## 2023-06-11 MED ORDER — DIBUCAINE (PERIANAL) 1 % EX OINT: 1.0000 | TOPICAL_OINTMENT | CUTANEOUS | Status: DC | PRN

## 2023-06-11 MED ORDER — OXYCODONE HCL 5 MG PO TABS: 10.0000 mg | ORAL_TABLET | ORAL | Status: DC | PRN

## 2023-06-11 MED ORDER — SIMETHICONE 80 MG PO CHEW: 80.0000 mg | CHEWABLE_TABLET | ORAL | Status: DC | PRN

## 2023-06-11 MED ORDER — ZOLPIDEM TARTRATE 5 MG PO TABS: 5.0000 mg | ORAL_TABLET | Freq: Every evening | ORAL | Status: DC | PRN

## 2023-06-11 MED ORDER — TRANEXAMIC ACID-NACL 1000-0.7 MG/100ML-% IV SOLN
1000.0000 mg | INTRAVENOUS | Status: AC
  Administered 2023-06-11: 1000 mg via INTRAVENOUS
  Filled 2023-06-11: qty 100

## 2023-06-11 MED ORDER — COCONUT OIL OIL: 1.0000 | TOPICAL_OIL | Status: DC | PRN

## 2023-06-11 MED ORDER — ONDANSETRON HCL 4 MG PO TABS: 4.0000 mg | ORAL_TABLET | ORAL | Status: DC | PRN

## 2023-06-11 MED ORDER — PRENATAL MULTIVITAMIN CH
1.0000 | ORAL_TABLET | Freq: Every day | ORAL | Status: DC
  Administered 2023-06-11 – 2023-06-12 (×2): 1 via ORAL
  Filled 2023-06-11 (×2): qty 1

## 2023-06-11 MED ORDER — SENNOSIDES-DOCUSATE SODIUM 8.6-50 MG PO TABS
2.0000 | ORAL_TABLET | Freq: Every day | ORAL | Status: DC
  Administered 2023-06-12: 2 via ORAL
  Filled 2023-06-11: qty 2

## 2023-06-11 MED ORDER — BENZOCAINE-MENTHOL 20-0.5 % EX AERO
1.0000 | INHALATION_SPRAY | CUTANEOUS | Status: DC | PRN
  Administered 2023-06-11: 1 via TOPICAL
  Filled 2023-06-11: qty 56

## 2023-06-11 MED ORDER — LIDOCAINE HCL (PF) 1 % IJ SOLN
INTRAMUSCULAR | Status: DC | PRN
  Administered 2023-06-11 (×2): 5 mL via EPIDURAL

## 2023-06-11 MED ORDER — ACETAMINOPHEN 325 MG PO TABS
650.0000 mg | ORAL_TABLET | ORAL | Status: DC | PRN
  Administered 2023-06-11 (×2): 650 mg via ORAL
  Filled 2023-06-11 (×2): qty 2

## 2023-06-11 MED ORDER — ONDANSETRON HCL 4 MG/2ML IJ SOLN: 4.0000 mg | INTRAMUSCULAR | Status: DC | PRN

## 2023-06-11 MED FILL — Oxytocin-Sodium Chloride 0.9% IV Soln 30 Unit/500ML: INTRAVENOUS | Qty: 500 | Status: AC

## 2023-06-11 NOTE — Lactation Note (Signed)
 This note was copied from a baby's chart. Lactation Consultation Note  Patient Name: Kimberly Hunter RUEAV'W Date: 06/11/2023 Age:30 hours Reason for consult: Initial assessment;Term;Maternal endocrine disorder;Breastfeeding assistance  P2- MOB reports that infant was very interested in feeding for the first couple of hours of life, but now he is uninterested even when he is awake. Per RN, infant just had a large emesis before LC entered room. The RN also attempted to assist with a breastfeeding before LC came into room, but infant would not nurse. LC used a gloved finger to assess infant's suck. Infant would not suck on the finger, even with stimulation. At this time. MOB decided to hand express to offer EBM since they had already tried to latch before. LC was able to hand express about 1 mL of EBM from MOB's left breast. Infant was not able, nor interested in spoon feeding, so LC finger fed EBM. Infant tolerated the feeding, but needed MOB to blow in his face while using chin support to help him swallow. LC updated RN of feeding. MOB reported feeling a "gush" from her vagina after the hand express, so LC notified her RN as well.  LC reviewed feeding infant on cue 8-12x in 24 hrs, not allowing infant to go over 3 hrs without a feeding, CDC milk storage guidelines, first 24 hr birthday nap, day 2 cluster feeding and LC services handout. LC encouraged MOB to call for further assistance as needed.  Maternal Data Has patient been taught Hand Expression?: Yes Does the patient have breastfeeding experience prior to this delivery?: Yes How long did the patient breastfeed?: 15 months with first child  Feeding Mother's Current Feeding Choice: Breast Milk  LATCH Score Latch: Too sleepy or reluctant, no latch achieved, no sucking elicited.  Audible Swallowing: None  Type of Nipple: Everted at rest and after stimulation  Comfort (Breast/Nipple): Soft / non-tender  Hold (Positioning): No  assistance needed to correctly position infant at breast.  LATCH Score: 6   Lactation Tools Discussed/Used Pump Education: Milk Storage  Interventions Interventions: Breast feeding basics reviewed;Hand express;Expressed milk;Education;LC Services brochure  Discharge Discharge Education: Engorgement and breast care;Warning signs for feeding baby Pump: DEBP;Personal (Spectra)  Consult Status Consult Status: Follow-up Date: 06/12/23 Follow-up type: In-patient    Dema Severin BS, IBCLC 06/11/2023, 5:12 PM

## 2023-06-11 NOTE — Lactation Note (Signed)
 This note was copied from a baby's chart. Lactation Consultation Note  Patient Name: Kimberly Hunter [id number] Date: 06/11/2023 Age:30 hours Reason for consult: Initial assessment;Term;Maternal endocrine disorder;Breastfeeding assistance  P2- MOB's RN informed LC that MOB was wanting to know if it is safe for her to start back on her Dextroamphetamine (Adderall) while breastfeeding. LC printed out the pages for this medication from [facility] Med[facility] Lactation book and reviewed the findings with MOB. This drug is an L3 medication. LC showed MOB were it lists the possible infant symptoms that she should monitor for. LC encouraged MOB to bring this paper to her physician to make sure everyone is comfortable with her starting this medication again. After reading the paper, MOB stated that she does not feel comfortable with taking it. LC showed MOB were it states that there are alternative medications that she may be able to take when discussing it with her physician. LC encouraged her to call for further assistance as needed.  Maternal Data Has patient been taught Hand Expression?: Yes Does the patient have breastfeeding experience prior to this delivery?: Yes How long did the patient breastfeed?: 15 months with first child  Feeding Mother's Current Feeding Choice: Breast Milk  LATCH Score Latch: Too sleepy or reluctant, no latch achieved, no sucking elicited.  Audible Swallowing: None  Type of Nipple: Everted at rest and after stimulation  Comfort (Breast/Nipple): Soft / non-tender  Hold (Positioning): No assistance needed to correctly position infant at breast.  LATCH Score: 6   Lactation Tools Discussed/Used Tools: Pump Breast pump type: Manual Pump Education: Setup, frequency, and cleaning;Milk Storage Reason for Pumping: baby not latching; great colostrum  Interventions Interventions: Breast feeding basics reviewed;Hand express;Expressed milk;Education;LC Services  brochure  Discharge Discharge Education: Engorgement and breast care;Warning signs for feeding baby Pump: DEBP;Personal (Spectra)  Consult Status Consult Status: Follow-up Date: 06/12/23 Follow-up type: In-patient    Dema Severin BS, IBCLC 06/11/2023, 5:56 PM

## 2023-06-11 NOTE — Anesthesia Procedure Notes (Signed)
 Epidural Patient location during procedure: OB Start time: 06/11/2023 2:52 AM End time: 06/11/2023 3:00 AM  Staffing Anesthesiologist: [place] Nation, MD Performed: anesthesiologist   Preanesthetic Checklist Completed: patient identified, IV checked, risks and benefits discussed, monitors and equipment checked, pre-op evaluation and timeout performed  Epidural Patient position: sitting Prep: DuraPrep Patient monitoring: heart rate, cardiac monitor, continuous pulse ox and blood pressure Approach: midline Location: L3-L4 Injection technique: LOR air  Needle:  Needle type: Tuohy  Needle gauge: 17 G Needle length: 9 cm Needle insertion depth: 6 cm Catheter type: closed end flexible Catheter size: 19 Gauge Catheter at skin depth: 11 cm Test dose: negative  Assessment Sensory level: T8  Additional Notes Patient identified. Risks/Benefits/Options discussed with patient including but not limited to bleeding, infection, nerve damage, paralysis, failed block, incomplete pain control, headache, blood pressure changes, nausea, vomiting, reactions to medication both or allergic, itching and postpartum back pain. Confirmed with bedside nurse the patient's most recent platelet count. Confirmed with patient that they are not currently taking any anticoagulation, have any bleeding history or any family history of bleeding disorders. Patient expressed understanding and wished to proceed. All questions were answered. Sterile technique was used throughout the entire procedure. Please see nursing notes for vital signs. Test dose was given through epidural catheter and negative prior to continuing to dose epidural or start infusion. Warning signs of high block given to the patient including shortness of breath, tingling/numbness in hands, complete motor block, or any concerning symptoms with instructions to call for help. Patient was given instructions on fall risk and not to get out of bed. All questions  and concerns addressed with instructions to call with any issues or inadequate analgesia.  Reason for block:procedure for pain

## 2023-06-11 NOTE — Anesthesia Preprocedure Evaluation (Signed)
 Anesthesia Evaluation  Patient identified by MRN, date of birth, ID band Patient awake    Reviewed: Allergy & Precautions, H&P , NPO status , Patient's Chart, lab work & pertinent test results  Airway Mallampati: II  TM Distance: >3 FB Neck ROM: Full    Dental no notable dental hx.    Pulmonary asthma    Pulmonary exam normal breath sounds clear to auscultation       Cardiovascular negative cardio ROS Normal cardiovascular exam Rhythm:Regular Rate:Normal     Neuro/Psych  PSYCHIATRIC DISORDERS Anxiety     negative neurological ROS     GI/Hepatic negative GI ROS, Neg liver ROS,,,  Endo/Other  pcos  Renal/GU negative Renal ROS  negative genitourinary   Musculoskeletal negative musculoskeletal ROS (+)    Abdominal   Peds negative pediatric ROS (+)  Hematology negative hematology ROS (+)   Anesthesia Other Findings Hx of pre-E in prior pregnancy   Reproductive/Obstetrics (+) Pregnancy                             Anesthesia Physical Anesthesia Plan  ASA: 2  Anesthesia Plan: Epidural   Post-op Pain Management:    Induction:   PONV Risk Score and Plan: Treatment may vary due to age or medical condition  Airway Management Planned: Natural Airway  Additional Equipment:   Intra-op Plan:   Post-operative Plan:   Informed Consent: I have reviewed the patients History and Physical, chart, labs and discussed the procedure including the risks, benefits and alternatives for the proposed anesthesia with the patient or authorized representative who has indicated his/her understanding and acceptance.       Plan Discussed with: Anesthesiologist  Anesthesia Plan Comments: (Patient identified. Risks, benefits, options discussed with patient including but not limited to bleeding, infection, nerve damage, paralysis, failed block, incomplete pain control, headache, blood pressure changes,  nausea, vomiting, reactions to medication, itching, and post partum back pain. Confirmed with bedside nurse the patient's most recent platelet count. Confirmed with the patient that they are not taking any anticoagulation, have any bleeding history or any family history of bleeding disorders. Patient expressed understanding and wishes to proceed. All questions were answered. )       Anesthesia Quick Evaluation

## 2023-06-11 NOTE — Progress Notes (Addendum)
 Zayley Arras is a 28 y.o. G3P1011 at [redacted]w[redacted]d   Subjective: Ctxs 5-6/10.  Denies HA, visual changes or abdominal pain.  Objective: BP 118/77 (BP Location: Right Arm)   Pulse 86   Temp (!) 97.5 F (36.4 C) (Axillary)   Resp 18   Ht 5\' 7"  (1.702 m)   Wt 104.3 kg   SpO2 99%   BMI 36.02 kg/m  No intake/output data recorded. No intake/output data recorded.  FHT:  FHR: 120 bpm, variability: moderate,  accelerations:  Present,  decelerations:  Absent UC:   q1-3 SVE:   Dilation: 2 Effacement (%): 50 Station: -3 Exam by:: Lance Morin, MD  IUPC placed  Labs: Lab Results  Component Value Date   WBC 10.8 (H) 06/09/2023   HGB 11.7 (L) 06/09/2023   HCT 36.4 06/09/2023   MCV 93.8 06/09/2023   PLT 222 06/09/2023    Assessment / Plan: IOL d/t SROM (yesterday at approx 1400) not in labor   Labor: Titrate pitocin as indicated to MVUs 180-220 Preeclampsia:  no signs or symptoms of toxicity Fetal Wellbeing:  Category I Pain Control:   pain medicine upon request I/D:   GBS negative, no s/sxs of infection Anticipated MOD:  NSVD  Purcell Nails, MD 06/11/2023, 12:22 AM

## 2023-06-11 NOTE — Anesthesia Postprocedure Evaluation (Signed)
 Anesthesia Post Note  Patient: Kimberly Hunter  Procedure(s) Performed: AN AD HOC LABOR EPIDURAL     Patient location during evaluation: Mother Baby Anesthesia Type: Epidural Level of consciousness: awake Pain management: satisfactory to patient Vital Signs Assessment: post-procedure vital signs reviewed and stable Respiratory status: spontaneous breathing Cardiovascular status: stable Anesthetic complications: no  No notable events documented.  Last Vitals:  Vitals:   06/11/23 1350 06/11/23 1715  BP: (!) 101/56 105/68  Pulse: 89 84  Resp: 16 18  Temp: 36.5 C 36.8 C  SpO2: 99% 100%    Last Pain:  Vitals:   06/11/23 1715  TempSrc: Oral  PainSc: 4    Pain Goal:                   [employer]

## 2023-06-12 ENCOUNTER — Other Ambulatory Visit (HOSPITAL_COMMUNITY): Payer: Self-pay

## 2023-06-12 LAB — CBC
HCT: 29.6 % — ABNORMAL LOW (ref 36.0–46.0)
Hemoglobin: 9.7 g/dL — ABNORMAL LOW (ref 12.0–15.0)
MCH: 30.3 pg (ref 26.0–34.0)
MCHC: 32.8 g/dL (ref 30.0–36.0)
MCV: 92.5 fL (ref 80.0–100.0)
Platelets: 168 10*3/uL (ref 150–400)
RBC: 3.2 MIL/uL — ABNORMAL LOW (ref 3.87–5.11)
RDW: 15.3 % (ref 11.5–15.5)
WBC: 8.2 10*3/uL (ref 4.0–10.5)
nRBC: 0 % (ref 0.0–0.2)

## 2023-06-12 LAB — BIRTH TISSUE RECOVERY COLLECTION (PLACENTA DONATION)

## 2023-06-12 MED ORDER — IBUPROFEN 600 MG PO TABS
600.0000 mg | ORAL_TABLET | Freq: Four times a day (QID) | ORAL | 0 refills | Status: AC
  Filled 2023-06-12: qty 30, 8d supply, fill #0

## 2023-06-12 NOTE — Discharge Summary (Signed)
 Postpartum Discharge Summary  Date of Service: 06/12/23     Patient Name: Kimberly Hunter DOB: 1995-10-27 MRN: [id number]  Date of admission: 06/09/2023 Delivery date:06/11/2023 Delivering provider: Osborn Coho Date of discharge: 06/12/2023  Admitting diagnosis: Indication for care in labor and delivery, antepartum [O75.9] PROM Intrauterine pregnancy: [redacted]w[redacted]d     Secondary diagnosis:  Principal Problem:   Indication for care in labor and delivery, antepartum  Additional problems:  -Size>dates; normal growth -Obesity -Hx of GDM -ADHD -Asthma -PCOS   Discharge diagnosis: Term Pregnancy Delivered                                              Post partum procedures: None Augmentation: Pitocin and Cytotec Complications: Shoulder dystocia for 35 seconds relieved with posterior arm delivery  Hospital course: Induction of Labor With Vaginal Delivery   28 y.o. yo [id number] at [redacted]w[redacted]d was admitted to the hospital 06/09/2023 for induction of labor.  Indication for induction: PROM.  Patient had an labor course complicated by 35 second shoulder dystocia relieved with posterior arm delivery. Membrane Rupture Time/Date: 2:30 PM,06/09/2023  Delivery Method:Vaginal, Spontaneous Operative Delivery:N/A Episiotomy: None Lacerations:  None Details of delivery can be found in separate delivery note.  Patient had a postpartum course complicated by nothing. Patient is discharged home 06/12/23.  Newborn Data: Birth date:06/11/2023 Birth time:6:42 AM Gender:Female Living status:Living Apgars:7 ,9  Weight:4070 g  Magnesium Sulfate received: No BMZ received: No Rhophylac:N/A MMR:N/A T-DaP:Given prenatally Flu: Yes RSV Vaccine received: No Transfusion:No Immunizations administered: There is no immunization history for the selected administration types on file for this patient.  Physical exam  Vitals:   06/11/23 1350 06/11/23 1715 06/11/23 2229 06/12/23 0650  BP: (!) 101/56 105/68 116/62  108/75  Pulse: 89 84 80 79  Resp: 16 18 18 18   Temp: 97.7 F (36.5 C) 98.2 F (36.8 C) 97.8 F (36.6 C) 98.6 F (37 C)  TempSrc: Oral Oral Oral Oral  SpO2: 99% 100% 100% 100%  Weight:      Height:       General: alert, cooperative, and no distress Lochia: appropriate Uterine Fundus: firm Incision: N/A DVT Evaluation: No evidence of DVT seen on physical exam. No significant calf/ankle edema. Labs: Lab Results  Component Value Date   WBC 8.2 06/12/2023   HGB 9.7 (L) 06/12/2023   HCT 29.6 (L) 06/12/2023   MCV 92.5 06/12/2023   PLT 168 06/12/2023      Latest Ref Rng & Units 05/20/2023    8:58 AM  CMP  Glucose 70 - 99 mg/dL 366   BUN 6 - 20 mg/dL 6   Creatinine 4.40 - 3.47 mg/dL 4.25   Sodium 956 - 387 mmol/L 138   Potassium 3.5 - 5.1 mmol/L 4.2   Chloride 98 - 111 mmol/L 108   CO2 22 - 32 mmol/L 19   Calcium 8.9 - 10.3 mg/dL 8.6   Total Protein 6.5 - 8.1 g/dL 6.0   Total Bilirubin 0.0 - 1.2 mg/dL 0.9   Alkaline Phos 38 - 126 U/L 92   AST 15 - 41 U/L 25   ALT 0 - 44 U/L 13    Edinburgh Score:    06/12/2023   12:08 AM  Edinburgh Postnatal Depression Scale Screening Tool  I have been able to laugh and see the funny side of things. 0  I have looked  forward with enjoyment to things. 0  I have blamed myself unnecessarily when things went wrong. 1  I have been anxious or worried for no good reason. 1  I have felt scared or panicky for no good reason. 0  Things have been getting on top of me. 0  I have been so unhappy that I have had difficulty sleeping. 0  I have felt sad or miserable. 0  I have been so unhappy that I have been crying. 0  The thought of harming myself has occurred to me. 0  Edinburgh Postnatal Depression Scale Total 2      After visit meds:  Allergies as of 06/12/2023   No Known Allergies      Medication List     STOP taking these medications    aspirin EC 81 MG tablet   magnesium 30 MG tablet       TAKE these medications     acetaminophen 500 MG tablet Commonly known as: TYLENOL Take 500 mg by mouth every 8 (eight) hours as needed for mild pain.   albuterol 108 (90 Base) MCG/ACT inhaler Commonly known as: VENTOLIN HFA Inhale 1-2 puffs into the lungs every 4 (four) hours as needed for wheezing or shortness of breath (or cough).   docusate sodium 100 MG capsule Commonly known as: Colace Take 1 capsule (100 mg total) by mouth 2 (two) times daily.   doxylamine (Sleep) 25 MG tablet Commonly known as: UNISOM Take 25 mg by mouth at bedtime as needed.   ibuprofen 600 MG tablet Commonly known as: ADVIL Take 1 tablet (600 mg total) by mouth every 6 (six) hours.   multivitamin-prenatal 27-0.8 MG Tabs tablet Take 1 tablet by mouth daily at 12 noon.   ondansetron 4 MG disintegrating tablet Commonly known as: ZOFRAN-ODT Take 1 tablet (4 mg total) by mouth every 8 (eight) hours as needed for nausea or vomiting.   pyridOXINE 50 MG tablet Commonly known as: B-6 Take 50 mg by mouth daily.         Discharge home in stable condition Infant Feeding: Breast Infant Disposition:home with mother Discharge instruction: per After Visit Summary and Postpartum booklet. Activity: Advance as tolerated. Pelvic rest for 6 weeks.  Diet: routine diet Anticipated Birth Control:  Considering Nexplanon Postpartum Appointment:6 weeks Additional Postpartum F/U:  None Future Appointments:No future appointments. Follow up Visit:  Follow-up Information     Steva Ready, DO Follow up in 6 week(s).   Specialty: Obstetrics and Gynecology Why: Our office will call you to schedule your 6 week postpartum visit. Contact information: [address] [place] [address] [phone]                     06/12/2023 Steva Ready, DO

## 2023-06-12 NOTE — Progress Notes (Signed)
 Postpartum Note Day #1  S:  Patient doing well.  Pain controlled.  Tolerating regular diet.   Ambulating and voiding without difficulty.  Desires discharge home today if infant is discharged. Denies fevers, chills, chest pain, SOB, N/V, or worsening bilateral LE edema.  Lochia: Minimal Infant feeding:  Breast Circumcision:  Desires prior to discharge Contraception:  None  O: Temp:  [97.7 F (36.5 C)-98.6 F (37 C)] 98.6 F (37 C) (03/12 0650) Pulse Rate:  [79-102] 79 (03/12 0650) Resp:  [16-18] 18 (03/12 0650) BP: (101-124)/(54-75) 108/75 (03/12 0650) SpO2:  [98 %-100 %] 100 % (03/12 0650) Gen: NAD, pleasant and cooperative CV: Regular rate Resp: Normal work of breathing Abdomen: soft, non-distended, non-tender throughout Uterus: firm, non-tender, below umbilicus Ext: Trace bilateral LE edema, no bilateral calf tenderness  Labs:  Recent Labs    06/09/23 2316 06/12/23 0458  HGB 11.7* 9.7*  HCT 36.4 29.6*    A/P: Patient is a 28 y.o. [id number] PPD#1 s/p SVD.  - Pain well controlled  - GU: UOP is adequate - GI: Tolerating regular diet - Activity: encouraged sitting up to chair and ambulation as tolerated - DVT Prophylaxis: Ambulation, SCDs in bed - Labs: stable as above  Circumcision Consent:  Routine circumcisions performed on newborns have been identified as voluntary, elective procedures by [employer] such as the [employer] [facility].  It is considered an elective procedure with no definitive medical indication and carries risks. Risks include but are not limited to bleeding, infection, damage to penis with possible need for further surgery, poor cosmesis, and local anesthetic risks. Circumcision will only be performed if patient is deemed to have normal anatomy by his Pediatrician, meets adequate criteria for a newborn of similar gestational age after birth and is without infection or other medical issue contraindicating an elective procedure. Patient  understands and agrees. Patient discussed with parents of infant.   Disposition:  D/C home today if infant is discharged.  Steva Ready, DO

## 2023-06-12 NOTE — Lactation Note (Signed)
 This note was copied from a baby's chart. Lactation Consultation Note  Patient Name: Kimberly Hunter [id number] Date: 06/12/2023 Age:30 hours Reason for consult: Maternal discharge;Follow-up assessment;Term  P2, 39 wks, @ 29 hrs of life. Encouraged mom to keep working on big mouth latch with baby and use EBM or coconut oil after each feed. Discussed cluster feeding overnight/ early morning brings in our milk supply, shared expectations of milk coming in. Highlighted risk of engorgement. Discussed hand pump/express to soften breasts, motrin as anti-inflammatory, and ice packs for 10-20 minutes post feed/pumping if still over-full is the best treatments for inflamed/engorged breasts. 18 mm flange provided for hand pump.   Maternal Data Does the patient have breastfeeding experience prior to this delivery?: Yes  Feeding Mother's Current Feeding Choice: Breast Milk  Interventions Interventions: Hand pump;Education;LC Services brochure;CDC milk storage guidelines  Discharge Discharge Education: Engorgement and breast care Pump: Manual;Personal;DEBP  Consult Status Consult Status: Complete Date: 06/12/23    Idamae Lusher 06/12/2023, 12:40 PM

## 2023-06-18 ENCOUNTER — Inpatient Hospital Stay (HOSPITAL_COMMUNITY): Admit: 2023-06-18 | Payer: 59

## 2023-06-18 ENCOUNTER — Other Ambulatory Visit (HOSPITAL_COMMUNITY): Payer: Self-pay

## 2023-06-24 ENCOUNTER — Telehealth (HOSPITAL_COMMUNITY): Payer: Self-pay | Admitting: *Deleted

## 2023-06-24 NOTE — Telephone Encounter (Signed)
 06/24/2023  Name: Kimberly Hunter MRN: [id number] DOB: 07-11-1995  Reason for Call:  Transition of Care Hospital Discharge Call  Contact Status: Patient Contact Status: Message  Language assistant needed:          Follow-Up Questions:    Inocente Salles Postnatal Depression Scale:  In the Past 7 Days:    PHQ2-9 Depression Scale:     Discharge Follow-up:    Post-discharge interventions: NA  Salena Saner, RN 06/24/2023 12:51

## 2023-11-21 ENCOUNTER — Other Ambulatory Visit (HOSPITAL_COMMUNITY): Payer: Self-pay
